# Patient Record
Sex: Female | Born: 1965 | Race: White | Hispanic: No | Marital: Single | Smoking: Never smoker
Health system: Southern US, Community
[De-identification: ages and names within clinical notes are randomized; demographics above are authoritative.]

## PROBLEM LIST (undated history)

## (undated) ENCOUNTER — Emergency Department (HOSPITAL_COMMUNITY): Admission: EM | Payer: Self-pay | Source: Home / Self Care

## (undated) DIAGNOSIS — E559 Vitamin D deficiency, unspecified: Secondary | ICD-10-CM

## (undated) DIAGNOSIS — R5381 Other malaise: Secondary | ICD-10-CM

## (undated) DIAGNOSIS — N951 Menopausal and female climacteric states: Secondary | ICD-10-CM

## (undated) DIAGNOSIS — IMO0002 Reserved for concepts with insufficient information to code with codable children: Secondary | ICD-10-CM

## (undated) DIAGNOSIS — H01009 Unspecified blepharitis unspecified eye, unspecified eyelid: Secondary | ICD-10-CM

## (undated) DIAGNOSIS — E785 Hyperlipidemia, unspecified: Secondary | ICD-10-CM

## (undated) DIAGNOSIS — F419 Anxiety disorder, unspecified: Secondary | ICD-10-CM

## (undated) DIAGNOSIS — R809 Proteinuria, unspecified: Secondary | ICD-10-CM

## (undated) DIAGNOSIS — G4700 Insomnia, unspecified: Secondary | ICD-10-CM

## (undated) DIAGNOSIS — R5383 Other fatigue: Secondary | ICD-10-CM

## (undated) DIAGNOSIS — F79 Unspecified intellectual disabilities: Secondary | ICD-10-CM

## (undated) DIAGNOSIS — E538 Deficiency of other specified B group vitamins: Secondary | ICD-10-CM

## (undated) DIAGNOSIS — M8580 Other specified disorders of bone density and structure, unspecified site: Secondary | ICD-10-CM

## (undated) DIAGNOSIS — K5900 Constipation, unspecified: Secondary | ICD-10-CM

## (undated) HISTORY — DX: Other fatigue: R53.83

## (undated) HISTORY — DX: Anxiety disorder, unspecified: F41.9

## (undated) HISTORY — DX: Proteinuria, unspecified: R80.9

## (undated) HISTORY — DX: Reserved for concepts with insufficient information to code with codable children: IMO0002

## (undated) HISTORY — DX: Vitamin D deficiency, unspecified: E55.9

## (undated) HISTORY — DX: Other specified disorders of bone density and structure, unspecified site: M85.80

## (undated) HISTORY — DX: Deficiency of other specified B group vitamins: E53.8

## (undated) HISTORY — DX: Unspecified intellectual disabilities: F79

## (undated) HISTORY — DX: Hyperlipidemia, unspecified: E78.5

## (undated) HISTORY — DX: Other malaise: R53.81

## (undated) HISTORY — DX: Insomnia, unspecified: G47.00

## (undated) HISTORY — DX: Menopausal and female climacteric states: N95.1

## (undated) HISTORY — DX: Constipation, unspecified: K59.00

## (undated) HISTORY — DX: Morbid (severe) obesity due to excess calories: E66.01

## (undated) HISTORY — DX: Unspecified blepharitis unspecified eye, unspecified eyelid: H01.009

---

## 2001-09-06 ENCOUNTER — Emergency Department (HOSPITAL_COMMUNITY): Admission: EM | Admit: 2001-09-06 | Discharge: 2001-09-06 | Payer: Self-pay

## 2001-09-06 ENCOUNTER — Encounter: Payer: Self-pay | Admitting: Emergency Medicine

## 2003-04-30 ENCOUNTER — Emergency Department (HOSPITAL_COMMUNITY): Admission: EM | Admit: 2003-04-30 | Discharge: 2003-04-30 | Payer: Self-pay | Admitting: Emergency Medicine

## 2009-05-29 ENCOUNTER — Emergency Department (HOSPITAL_COMMUNITY): Admission: EM | Admit: 2009-05-29 | Discharge: 2009-05-29 | Payer: Self-pay | Admitting: Emergency Medicine

## 2009-06-18 ENCOUNTER — Ambulatory Visit (HOSPITAL_COMMUNITY): Admission: RE | Admit: 2009-06-18 | Discharge: 2009-06-19 | Payer: Self-pay | Admitting: Surgery

## 2009-06-18 ENCOUNTER — Encounter (INDEPENDENT_AMBULATORY_CARE_PROVIDER_SITE_OTHER): Payer: Self-pay | Admitting: Surgery

## 2010-01-31 ENCOUNTER — Encounter: Payer: Self-pay | Admitting: Internal Medicine

## 2010-03-29 LAB — DIFFERENTIAL
Basophils Absolute: 0 10*3/uL (ref 0.0–0.1)
Basophils Relative: 0 % (ref 0–1)
Eosinophils Absolute: 0.2 10*3/uL (ref 0.0–0.7)
Monocytes Absolute: 0.8 10*3/uL (ref 0.1–1.0)
Neutro Abs: 8.4 10*3/uL — ABNORMAL HIGH (ref 1.7–7.7)
Neutrophils Relative %: 72 % (ref 43–77)

## 2010-03-29 LAB — COMPREHENSIVE METABOLIC PANEL
Albumin: 3.6 g/dL (ref 3.5–5.2)
Alkaline Phosphatase: 91 U/L (ref 39–117)
Alkaline Phosphatase: 147 U/L — ABNORMAL HIGH (ref 39–117)
BUN: 11 mg/dL (ref 6–23)
BUN: 9 mg/dL (ref 6–23)
CO2: 30 mEq/L (ref 19–32)
Chloride: 105 mEq/L (ref 96–112)
Chloride: 95 mEq/L — ABNORMAL LOW (ref 96–112)
Creatinine, Ser: 0.66 mg/dL (ref 0.4–1.2)
GFR calc Af Amer: >60 mL/min (ref >60)
GFR calc non Af Amer: >60 mL/min (ref >60)
Glucose, Bld: 92 mg/dL (ref 70–99)
Potassium: 4.1 mEq/L (ref 3.5–5.1)
Total Bilirubin: 0.2 mg/dL — ABNORMAL LOW (ref 0.3–1.2)
Total Bilirubin: 0.8 mg/dL (ref 0.3–1.2)

## 2010-03-29 LAB — URINALYSIS, ROUTINE W REFLEX MICROSCOPIC
Bilirubin Urine: NEGATIVE
Glucose, UA: NEGATIVE mg/dL
Ketones, ur: NEGATIVE mg/dL

## 2010-03-29 LAB — CBC
Hemoglobin: 13.8 g/dL (ref 12.0–15.0)
RBC: 4.4 MIL/uL (ref 3.87–5.11)

## 2010-03-29 LAB — URINE MICROSCOPIC-ADD ON

## 2010-03-29 LAB — PREGNANCY, URINE: Preg Test, Ur: NEGATIVE

## 2011-07-24 ENCOUNTER — Encounter (HOSPITAL_BASED_OUTPATIENT_CLINIC_OR_DEPARTMENT_OTHER): Payer: Self-pay | Admitting: *Deleted

## 2011-07-24 ENCOUNTER — Ambulatory Visit: Payer: Self-pay

## 2011-07-24 ENCOUNTER — Ambulatory Visit (INDEPENDENT_AMBULATORY_CARE_PROVIDER_SITE_OTHER): Payer: Self-pay | Admitting: Family Medicine

## 2011-07-24 ENCOUNTER — Emergency Department (HOSPITAL_BASED_OUTPATIENT_CLINIC_OR_DEPARTMENT_OTHER): Payer: Medicaid Other

## 2011-07-24 ENCOUNTER — Emergency Department (HOSPITAL_BASED_OUTPATIENT_CLINIC_OR_DEPARTMENT_OTHER)
Admission: EM | Admit: 2011-07-24 | Discharge: 2011-07-25 | Disposition: A | Discharge status: Home / Self Care | Payer: Medicaid Other | Attending: Emergency Medicine | Admitting: Emergency Medicine

## 2011-07-24 VITALS — BP 132/84 | HR 106 | Temp 98.7°F | Resp 18 | Ht 70.0 in | Wt 217.0 lb

## 2011-07-24 DIAGNOSIS — E860 Dehydration: Secondary | ICD-10-CM | POA: Insufficient documentation

## 2011-07-24 DIAGNOSIS — R109 Unspecified abdominal pain: Secondary | ICD-10-CM

## 2011-07-24 DIAGNOSIS — R1110 Vomiting, unspecified: Secondary | ICD-10-CM | POA: Insufficient documentation

## 2011-07-24 DIAGNOSIS — IMO0002 Reserved for concepts with insufficient information to code with codable children: Secondary | ICD-10-CM | POA: Insufficient documentation

## 2011-07-24 DIAGNOSIS — D72829 Elevated white blood cell count, unspecified: Secondary | ICD-10-CM | POA: Insufficient documentation

## 2011-07-24 DIAGNOSIS — T189XXA Foreign body of alimentary tract, part unspecified, initial encounter: Secondary | ICD-10-CM | POA: Insufficient documentation

## 2011-07-24 DIAGNOSIS — R10819 Abdominal tenderness, unspecified site: Secondary | ICD-10-CM | POA: Insufficient documentation

## 2011-07-24 LAB — URINE MICROSCOPIC-ADD ON

## 2011-07-24 LAB — URINALYSIS, ROUTINE W REFLEX MICROSCOPIC
Hgb urine dipstick: NEGATIVE
Ketones, ur: 15 mg/dL — AB
Protein, ur: 30 mg/dL — AB

## 2011-07-24 LAB — POCT CBC
HCT, POC: 47.4 % (ref 37.7–47.9)
Hemoglobin: 14.7 g/dL (ref 12.2–16.2)
Lymph, poc: 2.7 (ref 0.6–3.4)
MCV: 94.8 fL (ref 80–97)
Platelet Count, POC: 408 10*3/uL (ref 142–424)
RBC: 5 M/uL (ref 4.04–5.48)
WBC: 17.5 10*3/uL — AB (ref 4.6–10.2)

## 2011-07-24 LAB — COMPREHENSIVE METABOLIC PANEL
AST: 38 U/L — ABNORMAL HIGH (ref 0–37)
Albumin: 3.9 g/dL (ref 3.5–5.2)
BUN: 22 mg/dL (ref 6–23)
Calcium: 9.4 mg/dL (ref 8.4–10.5)
Creatinine, Ser: 0.7 mg/dL (ref 0.50–1.10)
Total Bilirubin: 0.3 mg/dL (ref 0.3–1.2)
Total Protein: 8.4 g/dL — ABNORMAL HIGH (ref 6.0–8.3)

## 2011-07-24 LAB — LIPASE, BLOOD: Lipase: 40 U/L (ref 11–59)

## 2011-07-24 MED ORDER — ONDANSETRON HCL 4 MG/2ML IJ SOLN: 2.0000 mg | Freq: Once | INTRAMUSCULAR | Status: AC

## 2011-07-24 MED ORDER — IOHEXOL 300 MG/ML  SOLN
40.0000 mL | Freq: Once | INTRAMUSCULAR | Status: AC | PRN
  Administered 2011-07-24: 40 mL via ORAL

## 2011-07-24 MED ORDER — ONDANSETRON HCL 4 MG/2ML IJ SOLN
4.0000 mg | Freq: Once | INTRAMUSCULAR | Status: AC
  Administered 2011-07-24: 4 mg via INTRAVENOUS
  Filled 2011-07-24: qty 2

## 2011-07-24 MED ORDER — SODIUM CHLORIDE 0.9 % IV BOLUS (SEPSIS)
1000.0000 mL | Freq: Once | INTRAVENOUS | Status: AC
  Administered 2011-07-24: 1000 mL via INTRAVENOUS

## 2011-07-24 MED ORDER — ONDANSETRON HCL 4 MG/2ML IJ SOLN
4.0000 mg | Freq: Once | INTRAMUSCULAR | Status: AC
  Administered 2011-07-24: 4 mg via INTRAVENOUS

## 2011-07-24 MED ORDER — IOHEXOL 300 MG/ML  SOLN
100.0000 mL | Freq: Once | INTRAMUSCULAR | Status: AC | PRN
  Administered 2011-07-24: 100 mL via INTRAVENOUS

## 2011-07-24 MED ORDER — MORPHINE SULFATE 4 MG/ML IJ SOLN
4.0000 mg | Freq: Once | INTRAMUSCULAR | Status: AC
  Administered 2011-07-24: 4 mg via INTRAVENOUS
  Filled 2011-07-24: qty 1

## 2011-07-24 MED ORDER — DOCUSATE SODIUM 100 MG PO CAPS: 100.0000 mg | ORAL_CAPSULE | Freq: Two times a day (BID) | ORAL | Status: AC

## 2011-07-24 MED ORDER — ONDANSETRON 4 MG PO TBDP: 4.0000 mg | ORAL_TABLET | Freq: Three times a day (TID) | ORAL | Status: AC | PRN

## 2011-07-24 NOTE — ED Notes (Addendum)
Vomiting for the last day. Mother states the patient swallowed a rubber piece from a toy and is unsure if the two are related. Was sent from clinic for elevated WBC and for an abd scan. 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

## 2011-07-24 NOTE — ED Notes (Signed)
Pt presents to ED today after being seen at [facility] for ? Swallowing of foreign body.  Pt has MR and is basically non-verbal.  Pt able to indicate that she has discomfort in abdomen.  Mother states that pt may have swallowed a piece of a plastic ball.  Pt did vomit shortly after.  Pt is in no visible distress at present

## 2011-07-24 NOTE — ED Notes (Signed)
Pt presents to ED today without indwelling IV catheter.  Not charted as removed from previous EPIC encounter.  

## 2011-07-24 NOTE — ED Provider Notes (Signed)
History     CSN: [id number]  Arrival date & time 07/24/11  1919   First MD Initiated Contact with Patient 07/24/11 2037      Chief Complaint  Patient presents with  . Emesis    (Consider location/radiation/quality/duration/timing/severity/associated sxs/prior treatment) HPI  46yoF h/o MR pw vomiting. Per mother pt has been vomiting x 2 days. Unable to tolerate Po at this time. Was seen at [facility] just pta with elevated WBC. She has been vomiting small pieces of a toy per mother. Last BM 3 days ago was normal. H/o chronic intermittent constipation. Denies fever. Pt nonverbal but mom seems to think she's in pain. Denies rash. Imm UTD. Mother noticed yesterday that she ingested pieces of a small soft rubber/plastic toy- was present in emesis previously. Vomiting worse today and unable to tolerate PO. Vomited several times prior to arrival. Was noted to have elevated WBC and nl XR abd series at [facility] prior to arrival. Referred to ED for further w/u and evaluation. Mom denies foul smelling urine.   Abd surgeries include cholecystectomy   ED Notes, ED Provider Notes from 07/24/11 0000 to 07/24/11 19:39:26       Flossie Dibble, RN 07/24/2011 19:34      Vomiting for the last day. Mother states the patient swallowed a rubber piece from a toy and is unsure if the two are related. Was sent from clinic for elevated WBC and for an abd scan. 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                 Original note by Flossie Dibble, RN at 07/24/2011 19:31         Flossie Dibble, RN 07/24/2011 19:31      Vomiting for the last day. Mother states the patient swallowed a rubber piece from a toy and is unsure if the two are related. Was sent from clinic for elevated WBC and for an abd scan.                History reviewed. No pertinent past medical history.  History reviewed. No pertinent past surgical history.  No family history on file.  History  Substance Use Topics  .  Smoking status: Never Smoker   . Smokeless tobacco: Not on file  . Alcohol Use: Not on file    OB History    Grav Para Term Preterm Abortions TAB SAB Ect Mult Living                  Review of Systems  All other systems reviewed and are negative.  except as noted HPI   Allergies  Compazine  Home Medications   Current Outpatient Rx  Name Route Sig Dispense Refill  . VITAMIN B12 PO Oral Take 1 tablet by mouth daily.    Marland Kitchen VITAMIN D (CHOLECALCIFEROL) PO Oral Take 1 tablet by mouth daily.    Marland Kitchen DOCUSATE SODIUM 100 MG PO CAPS Oral Take 1 capsule (100 mg total) by mouth every 12 (twelve) hours. 60 capsule 0  . ONDANSETRON 4 MG PO TBDP Oral Take 1 tablet (4 mg total) by mouth every 8 (eight) hours as needed for nausea. 10 tablet 0    BP 127/93  Pulse 88  Temp 98.4 F (36.9 C) (Oral)  Resp 18  SpO2 100%  LMP 07/11/2011  Physical Exam  Nursing note and vitals reviewed. Constitutional: She appears well-developed.  HENT:  Head: Atraumatic.  Mouth/Throat: No oropharyngeal exudate.  Mm dry  Eyes: Conjunctivae and EOM are normal. Pupils are equal, round, and reactive to light.  Neck: Normal range of motion. Neck supple.  Cardiovascular: Normal rate, regular rhythm, normal heart sounds and intact distal pulses.   Pulmonary/Chest: Effort normal and breath sounds normal. No respiratory distress. She has no wheezes. She has no rales.  Abdominal: Soft. She exhibits no distension. There is tenderness. There is no rebound and no guarding.       Mild diffuse ttp  Musculoskeletal: Normal range of motion.  Neurological: She is alert.  Skin: Skin is warm and dry. No rash noted.  Psychiatric:       Nonverbal but will respond to commands    Date: 07/25/2011  Rate: 78  Rhythm: normal sinus rhythm  QRS Axis: normal  Intervals: normal  ST/T Wave abnormalities: nonspecific T wave changes  Conduction Disutrbances:none  Narrative Interpretation:   Old EKG Reviewed: no sign change.  t wave inv now present V2  ED Course  Procedures (including critical care time)  Labs Reviewed  COMPREHENSIVE METABOLIC PANEL - Abnormal; Notable for the following:    Glucose, Bld 109 (*)     Total Protein 8.4 (*)     AST 38 (*)     All other components within normal limits  URINALYSIS, ROUTINE W REFLEX MICROSCOPIC - Abnormal; Notable for the following:    Color, Urine AMBER (*)  BIOCHEMICALS MAY BE AFFECTED BY COLOR   Specific Gravity, Urine 1.035 (*)     Bilirubin Urine SMALL (*)     Ketones, ur 15 (*)     Protein, ur 30 (*)     All other components within normal limits  LIPASE, BLOOD  URINE MICROSCOPIC-ADD ON   Ct Abdomen Pelvis W Contrast  07/24/2011  *RADIOLOGY REPORT*  Clinical Data: Possible swallowed foreign body.  The patient has mental retardation and is nonverbal but indicates abdominal pain. Vomiting.  Elevated white cell count.  CT ABDOMEN AND PELVIS WITH CONTRAST  Technique:  Multidetector CT imaging of the abdomen and pelvis was performed following the standard protocol during bolus administration of intravenous contrast.  Contrast: OMNIPAQUE IOHEXOL 300 MG/ML  SOLN, 40mL OMNIPAQUE IOHEXOL 300 MG/ML  SOLN  Comparison: 05/29/2009  Findings: The study is somewhat technically limited due to motion artifact.  Mild dependent atelectasis in the lung bases.  Solid appearing low attenuation lesion in the lateral segment left lobe the liver measures 2.1 cm in diameter, similar to previous study.  No new liver lesions are appreciated.  Enhancement pattern is indeterminate.  Stability since 2011 suggest a benign lesion.  Surgical absence of the gallbladder.  The pancreas, spleen, adrenal glands, kidneys, abdominal aorta, and retroperitoneal lymph nodes are unremarkable. The stomach is decompressed without obvious wall thickening.  Small bowel are decompressed.  Gas and stool in the colon without distension or wall thickening.  There is a rounded peripherally dense structure located  in the cecum which may represent ingested foreign body.  No free air or free fluid in the abdomen.  Pelvis:  The uterus and adnexal structures are not enlarged. Bladder is decompressed.  No free or loculated pelvic fluid collections.  The appendix appears normal.  No significant pelvic lymphadenopathy.  Degenerative changes in the lumbar spine. Visualization of alignment is limited due to motion artifact but appears to be normal.  IMPRESSION: Peripherally dense structure demonstrated in the cecum probably represents an ingested foreign body, given the history.  No evidence of bowel perforation, free fluid,  or free air.  No other focal acute process demonstrated.  Stable appearance of a hypodense lesion in the lateral segment left lobe of the liver.  Original Report Authenticated By: Marlon Pel, M.D.   Dg Abd 2 Views  07/24/2011  *RADIOLOGY REPORT*  Clinical Data: Abdominal pain and vomiting.  ABDOMEN - 2 VIEW  Comparison: Abdominal pelvic CT of 05/29/2009.  Findings: Frontal view of the chest demonstrates midline trachea. Normal heart size.  No pleural effusion or pneumothorax.  Mildly low lung volumes.  Mild vascular crowding at the lung bases.  Abominal films demonstrate motion degraded first image.  Subsequent 2 images demonstrate cholecystectomy clips.  Mild S-shaped thoracolumbar spine curvature.  No significant bowel distention. No definite upright view submitted.  Distal gas.  Phleboliths in the pelvis.  No abnormal abdominal calcifications.  IMPRESSION: No acute findings.  Original Report Authenticated By: Consuello Bossier, M.D.    1. Foreign body ingestion   2. Leukocytosis   3. Abdominal pain   Dehydration  MDM  Presents with abdominal pain, concern for obstruction with vomiting. VSS. Mild abd ttp on exam without r/g. Leukocytosis PTA 17K (See [facility] note in EPIC) Hgb ok-- did not repeat. Electrolytes unremarkable. CT as above- possible FB in cecum. No perforation or obstruction. Pt has been  given IVF, zofran, morphine. Tolerating PO in the Emergency Department without vomiting. Does not appear to be in pain at this time. D/W GI on call- home v/s admission for obs depending on Mother. Discussed extensively with mom/caregiver-- she would like to monitor her at home for fever, worsening pain, vomiting. She will f/u with her PMD in 2 days for recheck, sooner if needed. Given copy of all labs, XR, CT A/P prior to discharge. Given strict precautions for return.        Forbes Cellar, MD 07/25/11 [phone]

## 2011-07-24 NOTE — Progress Notes (Signed)
Date:  07/24/2011   Name:  Lauren Farmer   DOB:  Dec 01, 1965   MRN:  [id number]  PCP:  No primary provider on file.    Chief Complaint: Emesis and Constipation   History of Present Illness:  Lauren Farmer is a 46 y.o. very pleasant female patient who presents with the following:  Lauren Farmer is here today with her mother. 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  She ate some "fingers" off of a ball yesterday (a soft rubbery ball with stretchy rubbery fingers- a popular toy)- she then vomited,seeming to bring up the objects that she has swallowed.  However, has continued to have vomiting since yesterday.  She has vomitied every few minutes until her arrival here.  She has not been able to tolerate any fluids or any food.  She also has not had a BM in at least 2 days.  Mother has not noted any fever.  No diarrhea.    She has suffered from constipation in the past.  Never had any bowel operations- has had a chole but no other procedures.  She lives with her mother.    There is no problem list on file for this patient.   No past medical history on file.  No past surgical history on file.  History  Substance Use Topics  . Smoking status: Never Smoker   . Smokeless tobacco: Not on file  . Alcohol Use: Not on file    No family history on file.  Allergies no known allergies  Medication list has been reviewed and updated.  Current Outpatient Prescriptions on File Prior to Visit  Medication Sig Dispense Refill  . calcium-vitamin D (OSCAL WITH D) 500-200 MG-UNIT per tablet Take 1 tablet by mouth daily.        Review of Systems:  As per HPI- otherwise negative. Lauren Farmer is not able to verbally communicate her symptoms  Physical Examination: Filed Vitals:   07/24/11 1648  BP: 132/84  Pulse: 106  Temp: 98.7 F (37.1 C)  Resp: 18   Filed Vitals:   07/24/11 1648  Height: 5\' 10"  (1.778 m)  Weight: 217 lb (98.431 kg)   Body  mass index is 31.14 kg/(m^2). Ideal Body Weight: Weight in (lb) to have BMI = 25: 173.9   GEN: WDWN, NAD, Non-toxic, obese, is calm but does not seem happy HEENT: Atraumatic, Normocephalic. Neck supple. No masses.  Mouth is dry and red.  PEERl Ears and Nose: No external deformity. CV: RRR, No M/G/R. No JVD. No thrill. No extra heart sounds. PULM: CTA B, no wheezes, crackles, rhonchi. No retractions. No resp. distress. No accessory muscle use. ABD: S, ND.  There is some guarding in the epigastric area.  Not able to pinpoint area of tenderness.  She has active bowel sounds.   EXTR: No c/c/e NEURO Normal gait.  PSYCH: cooperative, and is easily calmed by mother.  Appropriate reactions to starting IV, etc.  Alert.   [facility] reading (PRIMARY) by  Dr. Patsy Lager.  Negative abdominal series.    ABDOMEN - 2 VIEW  Comparison: Abdominal pelvic CT of 05/29/2009.  Findings: Frontal view of the chest demonstrates midline trachea. Normal heart size. No pleural effusion or pneumothorax. Mildly low lung volumes. Mild vascular crowding at the lung bases.  Abominal films demonstrate motion degraded first image. Subsequent 2 images demonstrate cholecystectomy clips. Mild S-shaped thoracolumbar spine curvature. No significant bowel distention. No definite upright view submitted.  Distal gas. Phleboliths in the pelvis. No abnormal abdominal calcifications.  IMPRESSION: No acute findings.  Results for orders placed in visit on 07/24/11  POCT CBC      Component Value Range   WBC 17.5 (*) 4.6 - 10.2 K/uL   Lymph, poc 2.7  0.6 - 3.4   POC LYMPH PERCENT 15.2  10 - 50 %L   MID (cbc) 0.8  0 - 0.9   POC MID % 4.5  0 - 12 %M   POC Granulocyte 14.1 (*) 2 - 6.9   Granulocyte percent 80.3 (*) 37 - 80 %G   RBC 5.00  4.04 - 5.48 M/uL   Hemoglobin 14.7  12.2 - 16.2 g/dL   HCT, POC 96.0  45.4 - 47.9 %   MCV 94.8  80 - 97 fL   MCH, POC 29.4  27 - 31.2 pg   MCHC 31.0 (*) 31.8 - 35.4 g/dL   RDW, POC 09.8      Platelet Count, POC 408  142 - 424 K/uL   MPV 9.3  0 - 99.8 fL   Started IV in right AC (NS).  Also gave 4mg  of IV zofran.  However, when CBC results available the IV was discontinued.  She received about 400mg  of NS.    Assessment and Plan: 1. Abdominal  pain, other specified site  DG Abd 2 Views, POCT CBC, ondansetron (ZOFRAN) injection 2 mg, ondansetron (ZOFRAN) injection 4 mg   Concerning history of vomiting, abdominal pain, ingestion of FB and elevated WBC count. She may have an obstruction.  I advised her mother that Lauren Farmer needs ED evaluation and likely a CT scan. She is agreeable to this plan.  Waiting for a long time is difficult for Lauren Farmer, so her mother plans to take her to the [facility] ED.  She is aware that if Lauren Farmer needs admission, surgery, etc that a transfer will be necessary.    Abbe Amsterdam, MD

## 2011-07-24 NOTE — ED Notes (Signed)
Patient uses bedside commode with minimal assist.

## 2011-11-20 IMAGING — CT CT ABD-PELV W/ CM
2 of 6 series · 17 of 46 positions shown, 19 images · IV contrast (agent unspecified)
Comparison: None available. Report of CT scan 09/06/2001 reviewed.

CLINICAL DATA: Abdominal pain.  History prior bowel obstruction.

CT ABDOMEN AND PELVIS WITH CONTRAST
TECHNIQUE: Multidetector CT imaging of the abdomen and pelvis was
performed following the standard protocol during bolus
administration of intravenous contrast.
Contrast: 96 ml [id number].

[Series 2: abd pelvis · axial · 0.83mm/px · z∈[-368,-3]mm · 14 of 83 slices shown, 16 images]
[im 5/83  soft-tissue]
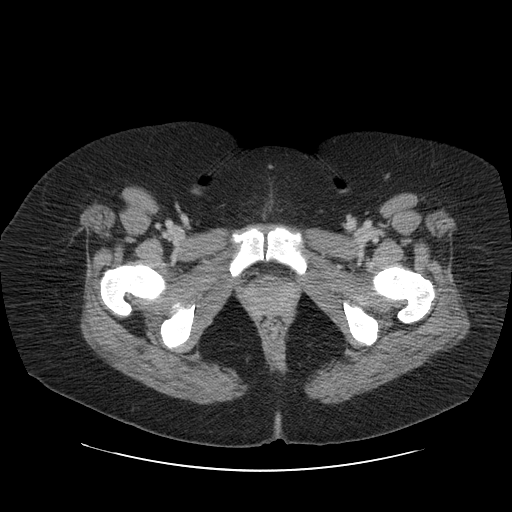
[im 5/83  bone]
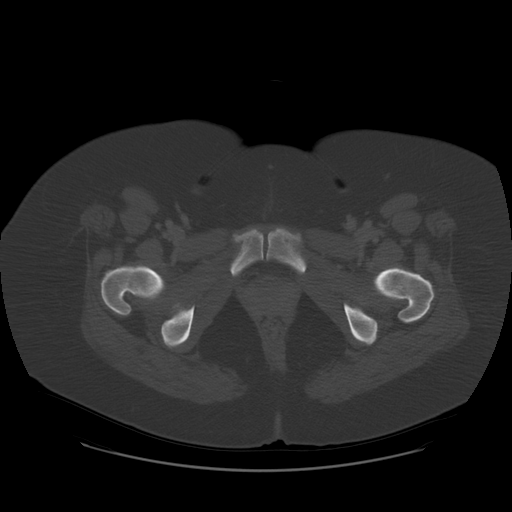
[im 10/83  soft-tissue]
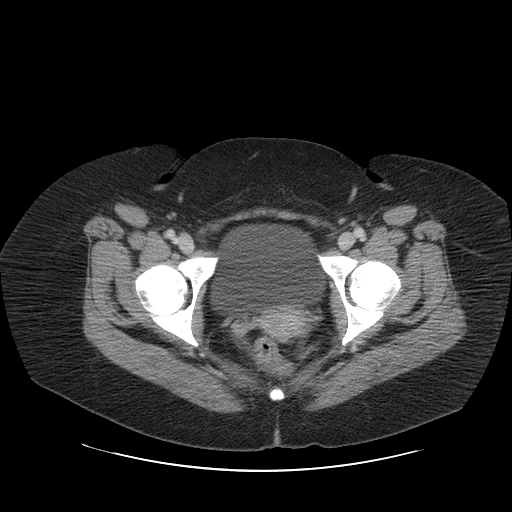
[im 19/83  soft-tissue]
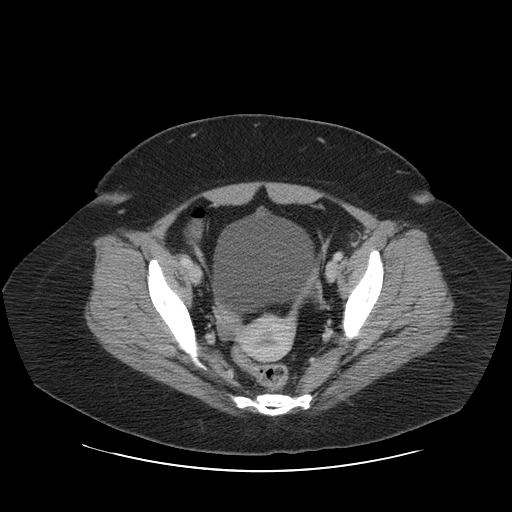
[im 23/83  soft-tissue]
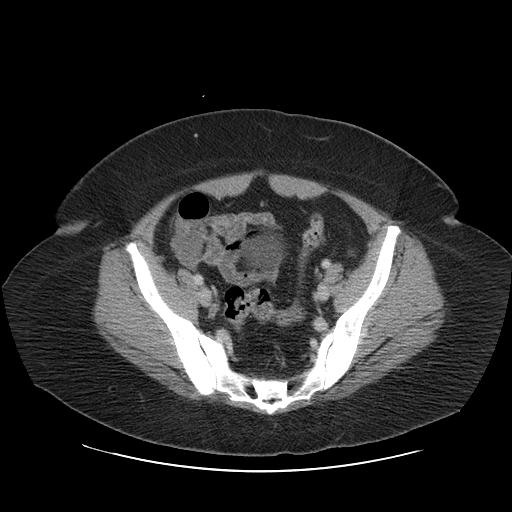
[im 28/83  soft-tissue]
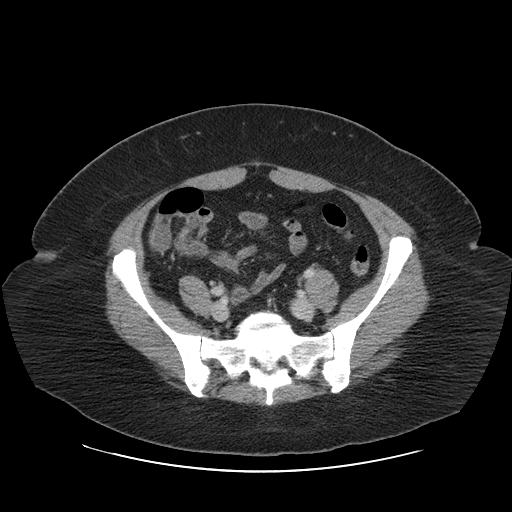
[im 32/83  soft-tissue]
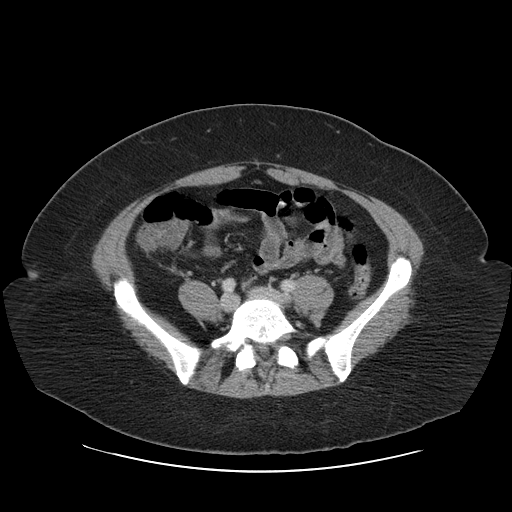
[im 37/83  soft-tissue]
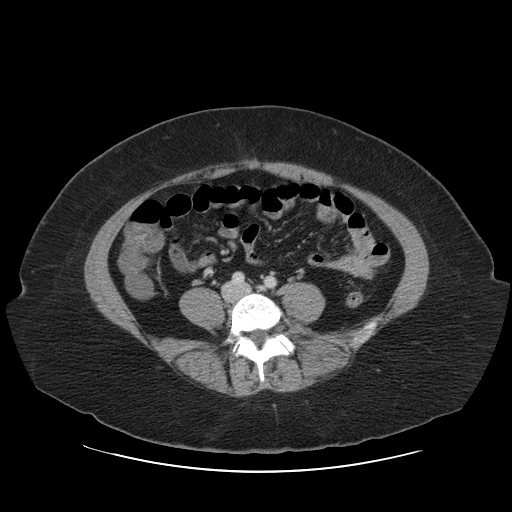
[im 46/83  soft-tissue]
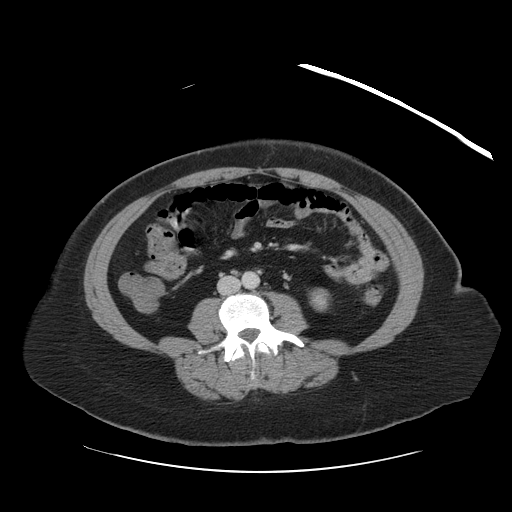
[im 51/83  soft-tissue]
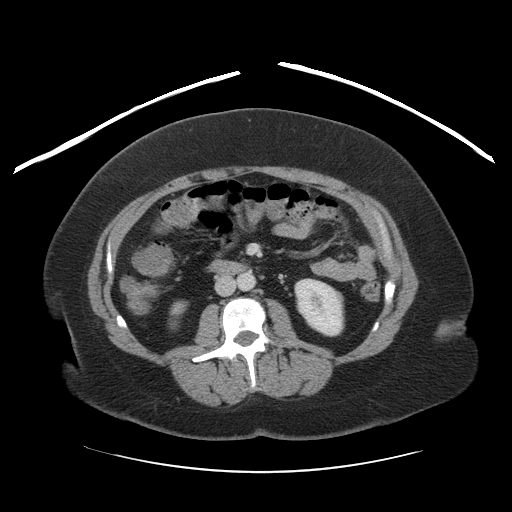
[im 51/83  bone]
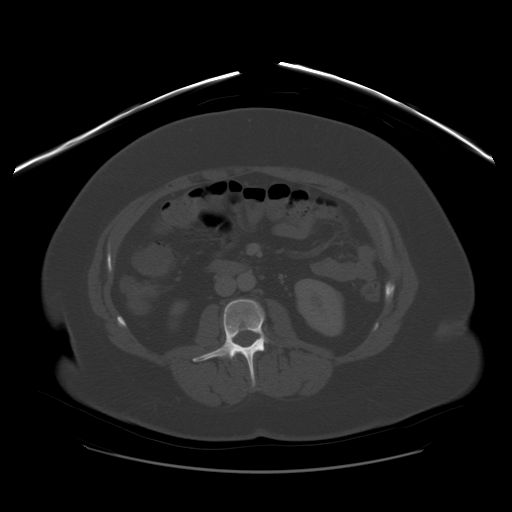
[im 55/83  soft-tissue]
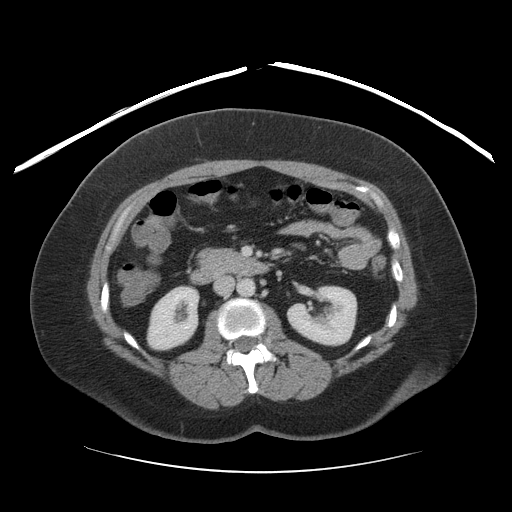
[im 60/83  soft-tissue]
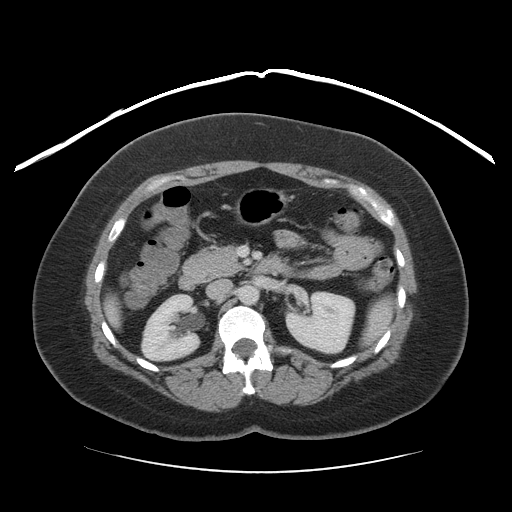
[im 64/83  soft-tissue]
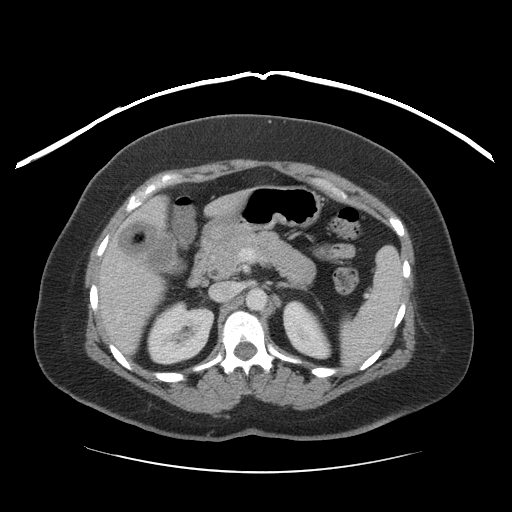
[im 73/83  soft-tissue]
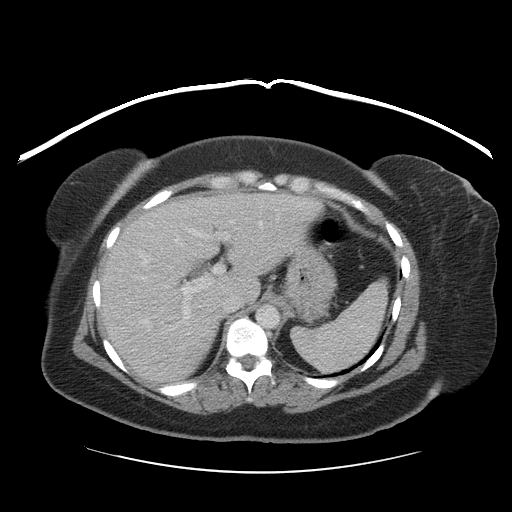
[im 78/83  soft-tissue]
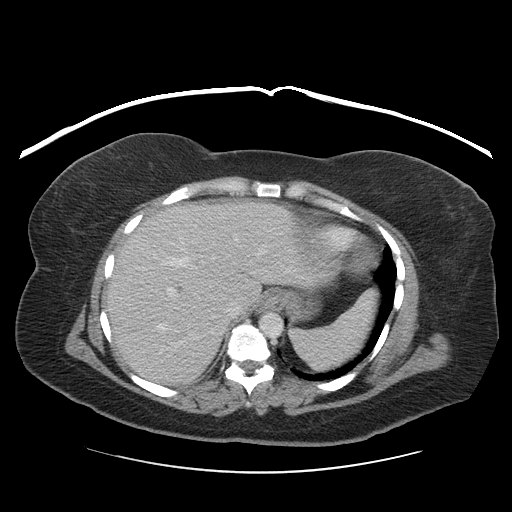

[Series 400: reformatted · coronal · 0.87mm/px · 3 of 90 slices shown]
[im 30/90  soft-tissue]
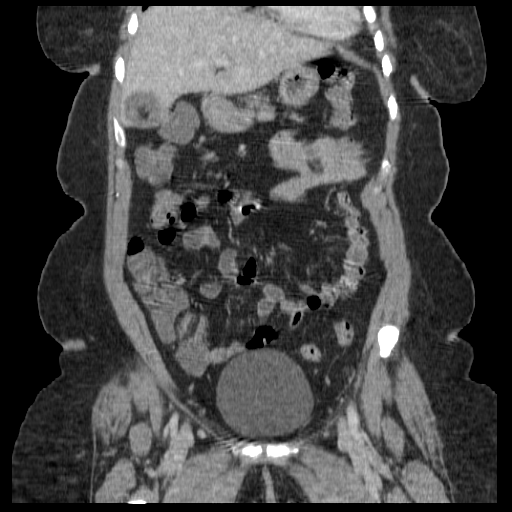
[im 40/90  soft-tissue]
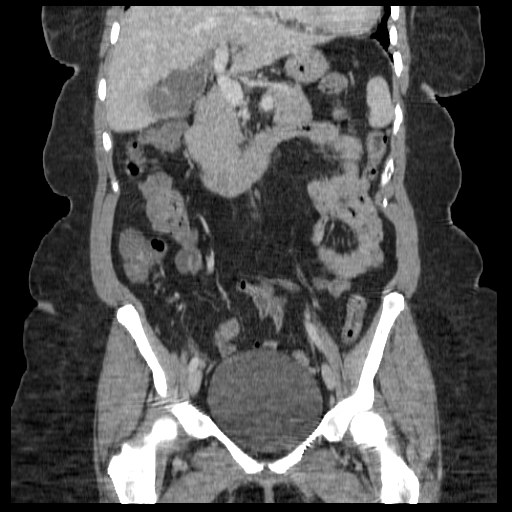
[im 50/90  soft-tissue]
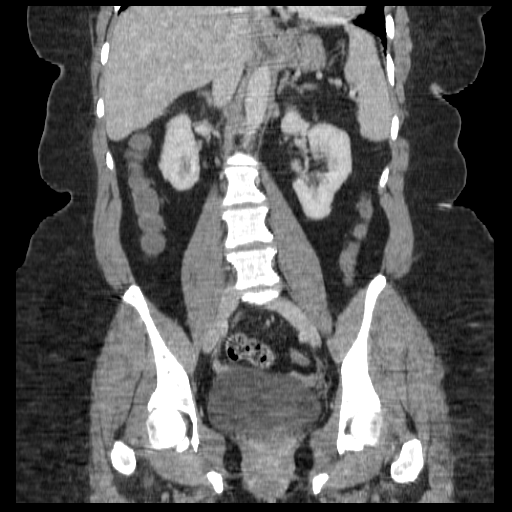

[17 of 46 positions shown; findings below may reference images not displayed]

FINDINGS: There is some dependent atelectatic change in the lung
bases.  Very small right pleural effusion is noted.  No left
pleural effusion.  Walls of the distal esophagus are markedly
thickened.

There is a large stone in the fundus the gallbladder measuring
cm.  No CT evidence of cholecystitis. There is a 1.8 x 1.8 cm
lesion in the left lobe the liver with Hounsfield unit measurements
of 59.5.  The lesion becomes nearly isointense to liver parenchyma
on delayed imaging.  No other focal liver lesion.  No intrahepatic
biliary ductal dilatation.  The adrenal glands, pancreas, spleen
and kidneys are all unremarkable.  There is no abdominal
lymphadenopathy or fluid.

The uterus, adnexa and urinary bladder are unremarkable.  The
stomach, colon and small bowel all appear normal.  No focal bony
abnormality.
IMPRESSION: 1.  Tiny right pleural effusion.
2.  Thickening of the walls of the distal esophagus could be due to
esophagitis.
3.  Low attenuation lesion in left lobe of the liver.  Similar
finding is described report of prior CT and is described as a
hemangioma.
4.  Single gallstone without evidence of cholecystitis.

## 2012-04-03 ENCOUNTER — Other Ambulatory Visit: Payer: Self-pay | Admitting: *Deleted

## 2012-04-03 DIAGNOSIS — IMO0002 Reserved for concepts with insufficient information to code with codable children: Secondary | ICD-10-CM

## 2012-04-03 DIAGNOSIS — F79 Unspecified intellectual disabilities: Secondary | ICD-10-CM

## 2012-04-03 DIAGNOSIS — E669 Obesity, unspecified: Secondary | ICD-10-CM

## 2012-04-12 ENCOUNTER — Other Ambulatory Visit: Payer: Self-pay

## 2012-04-12 DIAGNOSIS — IMO0002 Reserved for concepts with insufficient information to code with codable children: Secondary | ICD-10-CM

## 2012-04-12 DIAGNOSIS — E669 Obesity, unspecified: Secondary | ICD-10-CM

## 2012-04-12 DIAGNOSIS — F79 Unspecified intellectual disabilities: Secondary | ICD-10-CM

## 2012-04-13 LAB — TSH: TSH: 2.39 u[IU]/mL (ref 0.450–4.500)

## 2012-04-17 ENCOUNTER — Ambulatory Visit (INDEPENDENT_AMBULATORY_CARE_PROVIDER_SITE_OTHER): Payer: Medicaid Other | Admitting: Internal Medicine

## 2012-04-17 ENCOUNTER — Encounter: Payer: Self-pay | Admitting: Internal Medicine

## 2012-04-17 VITALS — BP 134/88 | HR 96 | Temp 98.3°F | Resp 20 | Ht 63.0 in | Wt 221.0 lb

## 2012-04-17 DIAGNOSIS — E785 Hyperlipidemia, unspecified: Secondary | ICD-10-CM

## 2012-04-17 DIAGNOSIS — F79 Unspecified intellectual disabilities: Secondary | ICD-10-CM

## 2012-04-17 DIAGNOSIS — E538 Deficiency of other specified B group vitamins: Secondary | ICD-10-CM

## 2012-04-17 DIAGNOSIS — J209 Acute bronchitis, unspecified: Secondary | ICD-10-CM

## 2012-04-17 NOTE — Patient Instructions (Signed)
Call for relapse of symptoms.

## 2012-04-17 NOTE — Assessment & Plan Note (Addendum)
2 weeks ago. Treated with azithromycin and Florastor. Symptoms have resolved.

## 2012-06-19 NOTE — Progress Notes (Signed)
  Subjective:    Patient ID: Lauren Farmer, female    DOB: 1965-05-25, 47 y.o.   MRN: [id number]  HPI Patient is here for an acute visit. He is a sore throat recently and complains of a cough and chest congestion does have a low-grade fever. She is producing yellow purulent sputum. No hemoptysis.   Review of Systems  Constitutional: Positive for fever. Negative for chills, diaphoresis, activity change, appetite change, fatigue and unexpected weight change.       Morbid obesity  HENT: Positive for rhinorrhea.   Eyes: Negative.   Respiratory: Positive for cough. Negative for shortness of breath and wheezing.   Cardiovascular: Negative for chest pain, palpitations and leg swelling.  Gastrointestinal: Negative.  Negative for nausea, abdominal pain, diarrhea and abdominal distention.  Endocrine: Negative.   Genitourinary: Negative.   Musculoskeletal: Negative for myalgias, back pain, joint swelling and gait problem.  Skin: Negative.   Neurological: Negative for dizziness, tremors, seizures, syncope, facial asymmetry and headaches.       Mentally retarded.  Hematological: Negative.   Psychiatric/Behavioral: Negative.        Objective:  Filed Vitals:   04/17/12 1608  BP: 134/88  Pulse: 96  Temp: 98.3 F (36.8 C)  TempSrc: Oral  Resp: 20  Height: 5\' 3"  (1.6 m)  Weight: 221 lb (100.245 kg)  SpO2: 97%          Physical Exam  Constitutional: She is oriented to person, place, and time. No distress.  Morbid obesity.  HENT:  Head: Normocephalic.  Right Ear: External ear normal.  Left Ear: External ear normal.  Nose: Nose normal.  Mouth/Throat: No oropharyngeal exudate.  Eyes: Conjunctivae are normal. Pupils are equal, round, and reactive to light. Right eye exhibits no discharge. Left eye exhibits no discharge.  Neck: Normal range of motion. Neck supple. No JVD present. No tracheal deviation present. No thyromegaly present.  Cardiovascular: Normal rate, regular rhythm and intact  distal pulses.  Exam reveals no gallop and no friction rub.   No murmur heard. Pulmonary/Chest: Effort normal. No respiratory distress. She has no wheezes. She has rales. She exhibits no tenderness.  Slight bronchial rattle.  Abdominal: Soft. Bowel sounds are normal. She exhibits no distension and no mass. There is no tenderness.  Musculoskeletal: Normal range of motion. She exhibits no edema and no tenderness.  Lymphadenopathy:    She has no cervical adenopathy.  Neurological: She is alert and oriented to person, place, and time. No cranial nerve deficit. Coordination normal.  Skin: Skin is warm and dry. No rash noted. No erythema. No pallor.  Psychiatric: She has a normal mood and affect.  Impulsivity. Cheerful. Mentally retarded.       Assessment & Plan:  Mental retardation  Chronic. Unchanged. Morbid obesity  No change in weight. Dietary control especially difficult. Hyperlipidemia  No recent lab. Vitamin B12 deficiency  Receiving B12 injections. Acute bronchitis  Seems to be clearing up at this time. No additional medicines are felt warranted. Her mother is to call us if symptoms relapse or if she runs fevers.

## 2012-08-14 ENCOUNTER — Ambulatory Visit (INDEPENDENT_AMBULATORY_CARE_PROVIDER_SITE_OTHER): Payer: Medicaid Other | Admitting: Nurse Practitioner

## 2012-08-14 ENCOUNTER — Other Ambulatory Visit: Payer: Self-pay | Admitting: Geriatric Medicine

## 2012-08-14 ENCOUNTER — Encounter: Payer: Self-pay | Admitting: Nurse Practitioner

## 2012-08-14 VITALS — BP 126/80 | HR 98 | Temp 98.1°F | Resp 20 | Ht 63.0 in | Wt 217.6 lb

## 2012-08-14 DIAGNOSIS — R109 Unspecified abdominal pain: Secondary | ICD-10-CM

## 2012-08-14 DIAGNOSIS — K5900 Constipation, unspecified: Secondary | ICD-10-CM

## 2012-08-14 DIAGNOSIS — B379 Candidiasis, unspecified: Secondary | ICD-10-CM

## 2012-08-14 MED ORDER — NYSTATIN 100000 UNIT/GM EX POWD: CUTANEOUS | Status: AC

## 2012-08-14 MED ORDER — FLUCONAZOLE 100 MG PO TABS: ORAL_TABLET | ORAL | Status: AC

## 2012-08-14 NOTE — Patient Instructions (Addendum)
Will get labs and xrays  To follow up if still having symptoms in 1 week

## 2012-08-14 NOTE — Progress Notes (Signed)
Patient ID: Lauren Farmer, female   DOB: 1965-11-03, 47 y.o.   MRN: [id number]   Allergies  Allergen Reactions  . Compazine (Prochlorperazine Edisylate)     Nervous, jerking head    Chief Complaint  Patient presents with  . Acute Visit    stomach pain,    HPI: Patient is a 47 y.o. female seen in the office today due to abdominal pains per mother  Pt with hx of mental retardation and does not talk and can not provide history; per mother for 2 weeks pt has whined a lot and brushes teeth constantly; will sit on the toilet-- mother was worried she was constipation- gave her docusate- was having BMs however was still complaining. Had 2 large BMs on Saturday and Sunday. Pt will sit on top of the commode with lid down and urinating on a pad; rolls the whole roll toliet paper off the roll. Mother reports she is still having regular menses  Goes to adult daycare- and mother asked if she had any alone time with any residents there; possible sexual contact- they reported this was not possible. Mother noted an increase in being anxious and nervous No fevers, appetite has been good- no change  Review of Systems:  Difficult to obtain; mostly from mother Review of Systems  Constitutional: Negative for fever and chills.  HENT: Negative for sore throat.        Reports teeth hurt  Respiratory: Negative for cough and shortness of breath.   Cardiovascular: Negative for chest pain.  Gastrointestinal: Positive for abdominal pain (points to lower abdomen) and constipation (per mother). Negative for diarrhea.  Genitourinary: Positive for frequency. Negative for dysuria and urgency.  Skin: Negative.   Neurological: Negative for weakness.  Psychiatric/Behavioral: The patient is nervous/anxious.      Past Medical History  Diagnosis Date  . Hyperlipidemia   . Anxiety   . Vitamin B12 deficiency   . Insomnia   . Unspecified mental or behavioral problem   . Intellectual disability   . Unspecified vitamin D  deficiency   . Morbid obesity   . Blepharitis, unspecified   . Unspecified constipation   . Other malaise and fatigue   . Proteinuria   . Osteopenia, senile   . Postmenopausal disorder   . Mental retardation    History reviewed. No pertinent past surgical history. Social History:   reports that she has never smoked. She does not have any smokeless tobacco history on file. She reports that she does not drink alcohol. Her drug history is not on file.  No family history on file.  Medications: Patient's Medications  New Prescriptions   No medications on file  Previous Medications   CYANOCOBALAMIN (VITAMIN B12 PO)    Take 1 tablet by mouth daily.   VITAMIN D, CHOLECALCIFEROL, PO    Take 1 tablet by mouth daily.  Modified Medications   No medications on file  Discontinued Medications   No medications on file     Physical Exam:  Filed Vitals:   08/14/12 1014  BP: 126/80  Pulse: 98  Temp: 98.1 F (36.7 C)  TempSrc: Oral  Resp: 20  Height: 5\' 3"  (1.6 m)  Weight: 217 lb 9.6 oz (98.703 kg)  SpO2: 97%   Physical Exam  Vitals reviewed. Constitutional: She is well-developed, well-nourished, and in no distress. No distress.  HENT:  Head: Normocephalic and atraumatic.  Mouth/Throat: Oropharynx is clear and moist. No oropharyngeal exudate.  Eyes: Conjunctivae and EOM are normal. Pupils  are equal, round, and reactive to light.  Neck: Normal range of motion. Neck supple.  Cardiovascular: Normal rate, regular rhythm and normal heart sounds.   Abdominal: Soft. Bowel sounds are normal. She exhibits no distension. There is tenderness.  Abdominal exam limited- pt ticklish per mother and she does not allow for proper exam   Genitourinary: Vagina normal. No vaginal discharge found.  No discharge noted  Musculoskeletal: Normal range of motion. She exhibits no edema and no tenderness.  Neurological: She is alert.  Skin: Skin is warm and dry. Rash (yeast rash in groin and vaginal area)  noted. She is not diaphoretic.     Labs reviewed: Basic Metabolic Panel:  Recent Labs  09/81/19 0858  TSH 2.390     Assessment/Plan 1. Abdominal pain At this time will get xrya and labs; will sent urine off for UA C&S due to rash will give diflcan daily for 3 days - DG Abd 1 View - CBC With differential/Platelet - Comprehensive metabolic panel - Urine culture - Urinalysis - fluconazole (DIFLUCAN) 100 MG tablet; Take 1 tablet daily for 3 days  Dispense: 3 tablet; Refill: 0  2. Candida infection Yeast rash- diflucan ordered - nystatin (MYCOSTATIN/NYSTOP) 100000 UNIT/GM POWD; Apply to affected areas as needed  Dispense: 15 g; Refill: 0  3. Constipation Cont stool softener; may use Miralax 17 gm daily if needed for constipation   To follow up in 1 week if no improvements with current treatment

## 2012-08-15 ENCOUNTER — Ambulatory Visit
Admission: RE | Admit: 2012-08-15 | Discharge: 2012-08-15 | Disposition: A | Discharge status: Home / Self Care | Payer: Medicaid Other | Source: Ambulatory Visit | Attending: Nurse Practitioner | Admitting: Nurse Practitioner

## 2012-08-15 LAB — CBC WITH DIFFERENTIAL
Basophils Absolute: 0 10*3/uL (ref 0.0–0.2)
Basos: 0 % (ref 0–3)
Eosinophils Absolute: 0.1 10*3/uL (ref 0.0–0.4)
HCT: 39.4 % (ref 34.0–46.6)
Lymphs: 18 % (ref 14–46)
MCH: 30 pg (ref 26.6–33.0)
MCHC: 33.8 g/dL (ref 31.5–35.7)
Monocytes Absolute: 1.2 10*3/uL — ABNORMAL HIGH (ref 0.1–0.9)
Neutrophils Absolute: 9.7 10*3/uL — ABNORMAL HIGH (ref 1.4–7.0)
RDW: 13.1 % (ref 12.3–15.4)

## 2012-08-15 LAB — COMPREHENSIVE METABOLIC PANEL
ALT: 21 IU/L (ref 0–32)
AST: 22 IU/L (ref 0–40)
Albumin/Globulin Ratio: 1.3 (ref 1.1–2.5)
Alkaline Phosphatase: 107 IU/L (ref 39–117)
BUN/Creatinine Ratio: 11 (ref 9–23)
Chloride: 98 mmol/L (ref 97–108)
GFR calc Af Amer: 121 mL/min/{1.73_m2} (ref >59)
GFR calc non Af Amer: 105 mL/min/{1.73_m2} (ref >59)
Glucose: 89 mg/dL (ref 65–99)
Potassium: 3.9 mmol/L (ref 3.5–5.2)
Sodium: 137 mmol/L (ref 134–144)
Total Bilirubin: 0.3 mg/dL (ref 0.0–1.2)
Total Protein: 7.6 g/dL (ref 6.0–8.5)

## 2012-08-15 LAB — URINE CULTURE

## 2012-10-17 ENCOUNTER — Ambulatory Visit: Payer: Self-pay | Admitting: Internal Medicine

## 2012-12-19 ENCOUNTER — Ambulatory Visit: Payer: Self-pay | Admitting: Internal Medicine

## 2017-01-27 ENCOUNTER — Emergency Department (HOSPITAL_COMMUNITY): Payer: Medicaid Other

## 2017-01-27 ENCOUNTER — Inpatient Hospital Stay (HOSPITAL_COMMUNITY): Payer: Medicaid Other

## 2017-01-27 ENCOUNTER — Encounter (HOSPITAL_COMMUNITY): Payer: Self-pay | Admitting: Emergency Medicine

## 2017-01-27 ENCOUNTER — Inpatient Hospital Stay (HOSPITAL_COMMUNITY)
Admission: EM | Admit: 2017-01-27 | Discharge: 2017-02-10 | DRG: 286 | Disposition: E | Payer: Medicaid Other | Attending: General Surgery | Admitting: General Surgery

## 2017-01-27 DIAGNOSIS — Z66 Do not resuscitate: Secondary | ICD-10-CM | POA: Diagnosis not present

## 2017-01-27 DIAGNOSIS — J9601 Acute respiratory failure with hypoxia: Secondary | ICD-10-CM | POA: Diagnosis not present

## 2017-01-27 DIAGNOSIS — Z4659 Encounter for fitting and adjustment of other gastrointestinal appliance and device: Secondary | ICD-10-CM

## 2017-01-27 DIAGNOSIS — E8770 Fluid overload, unspecified: Secondary | ICD-10-CM | POA: Diagnosis not present

## 2017-01-27 DIAGNOSIS — R402213 Coma scale, best verbal response, none, at hospital admission: Secondary | ICD-10-CM | POA: Diagnosis present

## 2017-01-27 DIAGNOSIS — S2249XA Multiple fractures of ribs, unspecified side, initial encounter for closed fracture: Secondary | ICD-10-CM | POA: Diagnosis present

## 2017-01-27 DIAGNOSIS — I361 Nonrheumatic tricuspid (valve) insufficiency: Secondary | ICD-10-CM | POA: Diagnosis not present

## 2017-01-27 DIAGNOSIS — D649 Anemia, unspecified: Secondary | ICD-10-CM | POA: Diagnosis present

## 2017-01-27 DIAGNOSIS — R402313 Coma scale, best motor response, none, at hospital admission: Secondary | ICD-10-CM | POA: Diagnosis present

## 2017-01-27 DIAGNOSIS — Y92410 Unspecified street and highway as the place of occurrence of the external cause: Secondary | ICD-10-CM | POA: Diagnosis not present

## 2017-01-27 DIAGNOSIS — E872 Acidosis: Secondary | ICD-10-CM | POA: Diagnosis present

## 2017-01-27 DIAGNOSIS — E669 Obesity, unspecified: Secondary | ICD-10-CM | POA: Diagnosis present

## 2017-01-27 DIAGNOSIS — E870 Hyperosmolality and hypernatremia: Secondary | ICD-10-CM | POA: Diagnosis not present

## 2017-01-27 DIAGNOSIS — Z6836 Body mass index (BMI) 36.0-36.9, adult: Secondary | ICD-10-CM

## 2017-01-27 DIAGNOSIS — J189 Pneumonia, unspecified organism: Secondary | ICD-10-CM | POA: Diagnosis present

## 2017-01-27 DIAGNOSIS — G9382 Brain death: Secondary | ICD-10-CM | POA: Diagnosis not present

## 2017-01-27 DIAGNOSIS — J9811 Atelectasis: Secondary | ICD-10-CM | POA: Diagnosis present

## 2017-01-27 DIAGNOSIS — R402113 Coma scale, eyes open, never, at hospital admission: Secondary | ICD-10-CM | POA: Diagnosis present

## 2017-01-27 DIAGNOSIS — Z978 Presence of other specified devices: Secondary | ICD-10-CM

## 2017-01-27 DIAGNOSIS — R6250 Unspecified lack of expected normal physiological development in childhood: Secondary | ICD-10-CM | POA: Diagnosis present

## 2017-01-27 DIAGNOSIS — F79 Unspecified intellectual disabilities: Secondary | ICD-10-CM | POA: Diagnosis present

## 2017-01-27 DIAGNOSIS — I469 Cardiac arrest, cause unspecified: Secondary | ICD-10-CM | POA: Diagnosis present

## 2017-01-27 DIAGNOSIS — Z529 Donor of unspecified organ or tissue: Secondary | ICD-10-CM

## 2017-01-27 DIAGNOSIS — G931 Anoxic brain damage, not elsewhere classified: Secondary | ICD-10-CM | POA: Diagnosis present

## 2017-01-27 LAB — PREPARE FRESH FROZEN PLASMA
UNIT DIVISION: 0
Unit division: 0

## 2017-01-27 LAB — I-STAT ARTERIAL BLOOD GAS, ED
ACID-BASE DEFICIT: 4 mmol/L — AB (ref 0.0–2.0)
Bicarbonate: 22.5 mmol/L (ref 20.0–28.0)
O2 Saturation: 100 %
PH ART: 7.289 — AB (ref 7.350–7.450)
PO2 ART: 437 mmHg — AB (ref 83.0–108.0)
TCO2: 24 mmol/L (ref 22–32)
pCO2 arterial: 46.9 mmHg (ref 32.0–48.0)

## 2017-01-27 LAB — GLUCOSE, CAPILLARY
GLUCOSE-CAPILLARY: 163 mg/dL — AB (ref 65–99)
Glucose-Capillary: 143 mg/dL — ABNORMAL HIGH (ref 65–99)

## 2017-01-27 LAB — I-STAT CHEM 8, ED
BUN: 19 mg/dL (ref 6–20)
CALCIUM ION: 1.06 mmol/L — AB (ref 1.15–1.40)
CHLORIDE: 105 mmol/L (ref 101–111)
Creatinine, Ser: 1.3 mg/dL — ABNORMAL HIGH (ref 0.44–1.00)
Glucose, Bld: 255 mg/dL — ABNORMAL HIGH (ref 65–99)
HCT: 41 % (ref 36.0–46.0)
Hemoglobin: 13.9 g/dL (ref 12.0–15.0)
POTASSIUM: 3.5 mmol/L (ref 3.5–5.1)
SODIUM: 143 mmol/L (ref 135–145)
TCO2: 20 mmol/L — ABNORMAL LOW (ref 22–32)

## 2017-01-27 LAB — ETHANOL: Alcohol, Ethyl (B): <10 mg/dL (ref <10)

## 2017-01-27 LAB — BPAM FFP
BLOOD PRODUCT EXPIRATION DATE: 201901272359
Blood Product Expiration Date: 201901252359
ISSUE DATE / TIME: 201901181006
ISSUE DATE / TIME: 201901181006
UNIT TYPE AND RH: 6200
Unit Type and Rh: 6200

## 2017-01-27 LAB — COMPREHENSIVE METABOLIC PANEL
ALBUMIN: 3.3 g/dL — AB (ref 3.5–5.0)
ALK PHOS: 160 U/L — AB (ref 38–126)
ALT: 127 U/L — ABNORMAL HIGH (ref 14–54)
ANION GAP: 25 — AB (ref 5–15)
AST: 165 U/L — ABNORMAL HIGH (ref 15–41)
BUN: 15 mg/dL (ref 6–20)
CALCIUM: 8.9 mg/dL (ref 8.9–10.3)
CO2: 15 mmol/L — AB (ref 22–32)
Chloride: 104 mmol/L (ref 101–111)
Creatinine, Ser: 1.65 mg/dL — ABNORMAL HIGH (ref 0.44–1.00)
GFR calc Af Amer: 41 mL/min — ABNORMAL LOW (ref >60)
GFR calc non Af Amer: 36 mL/min — ABNORMAL LOW (ref >60)
GLUCOSE: 265 mg/dL — AB (ref 65–99)
POTASSIUM: 3.6 mmol/L (ref 3.5–5.1)
SODIUM: 144 mmol/L (ref 135–145)
Total Bilirubin: 0.8 mg/dL (ref 0.3–1.2)
Total Protein: 7.7 g/dL (ref 6.5–8.1)

## 2017-01-27 LAB — URINALYSIS, ROUTINE W REFLEX MICROSCOPIC
BILIRUBIN URINE: NEGATIVE
Glucose, UA: 50 mg/dL — AB
KETONES UR: NEGATIVE mg/dL
Leukocytes, UA: NEGATIVE
Nitrite: NEGATIVE
Protein, ur: 100 mg/dL — AB
Specific Gravity, Urine: 1.02 (ref 1.005–1.030)
pH: 6 (ref 5.0–8.0)

## 2017-01-27 LAB — CBC
HEMATOCRIT: 41.9 % (ref 36.0–46.0)
Hemoglobin: 12.9 g/dL (ref 12.0–15.0)
MCH: 30.6 pg (ref 26.0–34.0)
MCHC: 30.8 g/dL (ref 30.0–36.0)
MCV: 99.5 fL (ref 78.0–100.0)
Platelets: 320 10*3/uL (ref 150–400)
RBC: 4.21 MIL/uL (ref 3.87–5.11)
RDW: 14.1 % (ref 11.5–15.5)
WBC: 17.5 10*3/uL — ABNORMAL HIGH (ref 4.0–10.5)

## 2017-01-27 LAB — ABO/RH: ABO/RH(D): A POS

## 2017-01-27 LAB — PROTIME-INR
INR: 1.15
Prothrombin Time: 14.6 seconds (ref 11.4–15.2)

## 2017-01-27 LAB — I-STAT CG4 LACTIC ACID, ED: LACTIC ACID, VENOUS: 15.58 mmol/L — AB (ref 0.5–1.9)

## 2017-01-27 LAB — LACTIC ACID, PLASMA: Lactic Acid, Venous: 2.5 mmol/L (ref 0.5–1.9)

## 2017-01-27 LAB — MRSA PCR SCREENING: MRSA by PCR: NEGATIVE

## 2017-01-27 MED ORDER — ONDANSETRON 4 MG PO TBDP: 4.0000 mg | ORAL_TABLET | Freq: Four times a day (QID) | ORAL | Status: DC | PRN

## 2017-01-27 MED ORDER — FENTANYL CITRATE (PF) 100 MCG/2ML IJ SOLN
100.0000 ug | INTRAMUSCULAR | Status: DC | PRN
  Administered 2017-01-27 (×2): 100 ug via INTRAVENOUS
  Filled 2017-01-27 (×2): qty 2

## 2017-01-27 MED ORDER — ACETAMINOPHEN 160 MG/5ML PO SOLN
650.0000 mg | Freq: Four times a day (QID) | ORAL | Status: DC | PRN
  Administered 2017-01-28 (×2): 650 mg
  Filled 2017-01-27 (×3): qty 20.3

## 2017-01-27 MED ORDER — ORAL CARE MOUTH RINSE
15.0000 mL | OROMUCOSAL | Status: DC
  Administered 2017-01-27 – 2017-02-01 (×47): 15 mL via OROMUCOSAL

## 2017-01-27 MED ORDER — CHLORHEXIDINE GLUCONATE 0.12% ORAL RINSE (MEDLINE KIT): 15.0000 mL | Freq: Two times a day (BID) | OROMUCOSAL | Status: DC

## 2017-01-27 MED ORDER — HYDRALAZINE HCL 20 MG/ML IJ SOLN
10.0000 mg | INTRAMUSCULAR | Status: DC | PRN
  Administered 2017-01-28 – 2017-01-31 (×3): 10 mg via INTRAVENOUS
  Filled 2017-01-27 (×3): qty 1

## 2017-01-27 MED ORDER — SODIUM CHLORIDE 0.9 % IV SOLN
INTRAVENOUS | Status: AC | PRN
  Administered 2017-01-27: 1000 mL via INTRAVENOUS

## 2017-01-27 MED ORDER — ORAL CARE MOUTH RINSE
15.0000 mL | Freq: Four times a day (QID) | OROMUCOSAL | Status: DC
  Administered 2017-01-27: 15 mL via OROMUCOSAL

## 2017-01-27 MED ORDER — FENTANYL CITRATE (PF) 100 MCG/2ML IJ SOLN
100.0000 ug | INTRAMUSCULAR | Status: DC | PRN
  Administered 2017-01-28 (×3): 100 ug via INTRAVENOUS
  Filled 2017-01-27 (×3): qty 2

## 2017-01-27 MED ORDER — PANTOPRAZOLE SODIUM 40 MG IV SOLR
40.0000 mg | Freq: Every day | INTRAVENOUS | Status: DC
  Administered 2017-01-27 – 2017-01-31 (×5): 40 mg via INTRAVENOUS
  Filled 2017-01-27 (×5): qty 40

## 2017-01-27 MED ORDER — HEPARIN SODIUM (PORCINE) 5000 UNIT/ML IJ SOLN
5000.0000 [IU] | Freq: Three times a day (TID) | INTRAMUSCULAR | Status: DC
  Administered 2017-01-27 – 2017-01-31 (×12): 5000 [IU] via SUBCUTANEOUS
  Filled 2017-01-27 (×12): qty 1

## 2017-01-27 MED ORDER — MIDAZOLAM HCL 2 MG/2ML IJ SOLN
2.0000 mg | INTRAMUSCULAR | Status: AC | PRN
  Administered 2017-01-27 – 2017-01-28 (×3): 2 mg via INTRAVENOUS
  Filled 2017-01-27 (×2): qty 2

## 2017-01-27 MED ORDER — ONDANSETRON HCL 4 MG/2ML IJ SOLN: 4.0000 mg | Freq: Four times a day (QID) | INTRAMUSCULAR | Status: DC | PRN

## 2017-01-27 MED ORDER — PIPERACILLIN-TAZOBACTAM 3.375 G IVPB
3.3750 g | Freq: Three times a day (TID) | INTRAVENOUS | Status: DC
  Administered 2017-01-27 – 2017-01-31 (×12): 3.375 g via INTRAVENOUS
  Filled 2017-01-27 (×13): qty 50

## 2017-01-27 MED ORDER — INSULIN ASPART 100 UNIT/ML ~~LOC~~ SOLN
0.0000 [IU] | SUBCUTANEOUS | Status: DC
  Administered 2017-01-27: 2 [IU] via SUBCUTANEOUS
  Administered 2017-01-27 – 2017-01-30 (×6): 1 [IU] via SUBCUTANEOUS
  Administered 2017-01-31: 2 [IU] via SUBCUTANEOUS
  Administered 2017-01-31: 1 [IU] via SUBCUTANEOUS

## 2017-01-27 MED ORDER — CHLORHEXIDINE GLUCONATE 0.12% ORAL RINSE (MEDLINE KIT)
15.0000 mL | Freq: Two times a day (BID) | OROMUCOSAL | Status: DC
  Administered 2017-01-27 – 2017-02-01 (×9): 15 mL via OROMUCOSAL

## 2017-01-27 MED ORDER — MIDAZOLAM HCL 2 MG/2ML IJ SOLN
2.0000 mg | INTRAMUSCULAR | Status: DC | PRN
  Filled 2017-01-27: qty 2

## 2017-01-27 MED ORDER — IOPAMIDOL (ISOVUE-300) INJECTION 61%
100.0000 mL | Freq: Once | INTRAVENOUS | Status: AC | PRN
Start: 2017-01-27 — End: 2017-01-27
  Administered 2017-01-27: 100 mL via INTRAVENOUS

## 2017-01-27 MED ORDER — PANTOPRAZOLE SODIUM 40 MG PO TBEC: 40.0000 mg | DELAYED_RELEASE_TABLET | Freq: Every day | ORAL | Status: DC

## 2017-01-27 MED ORDER — POTASSIUM CHLORIDE IN NACL 20-0.9 MEQ/L-% IV SOLN
INTRAVENOUS | Status: DC
  Administered 2017-01-27 – 2017-01-29 (×5): via INTRAVENOUS
  Filled 2017-01-27 (×7): qty 1000

## 2017-01-27 NOTE — ED Notes (Signed)
Report received from Sabrina at the bedside
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
## 2017-01-27 NOTE — ED Notes (Signed)
Pt had two more seizure like episodes, PRN medication given, patient is now relaxed and respirations are WNL.

## 2017-01-27 NOTE — ED Notes (Signed)
Urinary Catheter now has urine output.

## 2017-01-27 NOTE — ED Notes (Signed)
Pt going to CT

## 2017-01-27 NOTE — ED Notes (Signed)
Oxygen saturation dropped down to 82%. Oxygen turned from 40% to 60% and patient suctioned again by RT. Oxygen saturation is now 84%, O2 titrated up to 100% and suctioned once again, saturation then 87%, HOB elevated and saturation is now 92%. Remaining at bedside and will continue to monitor.

## 2017-01-27 NOTE — ED Notes (Signed)
Lactic acid must be redrawn per lab
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
## 2017-01-27 NOTE — ED Notes (Signed)
Lactic acid redrawn and sent to lab

## 2017-01-27 NOTE — ED Triage Notes (Signed)
See trauma narrator 

## 2017-01-27 NOTE — ED Notes (Signed)
Lactic 15.58 results given to Dr. Lindie SpruceWyatt and ordered for another fluid bolus.
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
## 2017-01-27 NOTE — ED Notes (Signed)
Pt is moving arms, and coughing over the tube O2 sats 87% on vent. PT suctioned by this RN and obtained copious amounts of dark yellow thick sputum. Dr. Lindie SpruceWyatt paged and respiratory at bedside adjusting oxygen levels.

## 2017-01-27 NOTE — ED Notes (Signed)
Pt had two episodes of seizure like activity lasting 5-7 seconds of convulsions all over and rolling her eyes back in her head, Dr. Lindie SpruceWyatt paged. Respiratory at bedside doing assessment.

## 2017-01-27 NOTE — Progress Notes (Signed)
Pt transported from ED to ICU on vent.  RT will continue to monitor.  

## 2017-01-27 NOTE — ED Notes (Addendum)
Dr. Jeraldine LootsLockwood at bedside and informed of seizure like activity and respirations 38 breathes per minute.
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
## 2017-01-27 NOTE — ED Notes (Signed)
rn remains at bedside 

## 2017-01-28 ENCOUNTER — Inpatient Hospital Stay (HOSPITAL_COMMUNITY): Payer: Medicaid Other

## 2017-01-28 LAB — COMPREHENSIVE METABOLIC PANEL
ALT: 113 U/L — AB (ref 14–54)
ANION GAP: 13 (ref 5–15)
AST: 130 U/L — ABNORMAL HIGH (ref 15–41)
Albumin: 2.9 g/dL — ABNORMAL LOW (ref 3.5–5.0)
Alkaline Phosphatase: 151 U/L — ABNORMAL HIGH (ref 38–126)
BUN: 16 mg/dL (ref 6–20)
CO2: 20 mmol/L — AB (ref 22–32)
CREATININE: 0.92 mg/dL (ref 0.44–1.00)
Calcium: 7.6 mg/dL — ABNORMAL LOW (ref 8.9–10.3)
Chloride: 111 mmol/L (ref 101–111)
Glucose, Bld: 141 mg/dL — ABNORMAL HIGH (ref 65–99)
POTASSIUM: 3.6 mmol/L (ref 3.5–5.1)
SODIUM: 144 mmol/L (ref 135–145)
Total Bilirubin: 0.8 mg/dL (ref 0.3–1.2)
Total Protein: 7.1 g/dL (ref 6.5–8.1)

## 2017-01-28 LAB — CBC
HEMATOCRIT: 36.2 % (ref 36.0–46.0)
Hemoglobin: 11.7 g/dL — ABNORMAL LOW (ref 12.0–15.0)
MCH: 30.1 pg (ref 26.0–34.0)
MCHC: 32.3 g/dL (ref 30.0–36.0)
MCV: 93.1 fL (ref 78.0–100.0)
PLATELETS: 223 10*3/uL (ref 150–400)
RBC: 3.89 MIL/uL (ref 3.87–5.11)
RDW: 14 % (ref 11.5–15.5)
WBC: 19.2 10*3/uL — ABNORMAL HIGH (ref 4.0–10.5)

## 2017-01-28 LAB — GLUCOSE, CAPILLARY
GLUCOSE-CAPILLARY: 110 mg/dL — AB (ref 65–99)
GLUCOSE-CAPILLARY: 115 mg/dL — AB (ref 65–99)
GLUCOSE-CAPILLARY: 130 mg/dL — AB (ref 65–99)
GLUCOSE-CAPILLARY: 135 mg/dL — AB (ref 65–99)
Glucose-Capillary: 126 mg/dL — ABNORMAL HIGH (ref 65–99)
Glucose-Capillary: 134 mg/dL — ABNORMAL HIGH (ref 65–99)

## 2017-01-28 LAB — CDS SEROLOGY

## 2017-01-28 LAB — HIV ANTIBODY (ROUTINE TESTING W REFLEX): HIV SCREEN 4TH GENERATION: NONREACTIVE

## 2017-01-28 LAB — LACTIC ACID, PLASMA: Lactic Acid, Venous: 1.2 mmol/L (ref 0.5–1.9)

## 2017-01-28 MED ORDER — METOPROLOL TARTRATE 5 MG/5ML IV SOLN
10.0000 mg | Freq: Four times a day (QID) | INTRAVENOUS | Status: DC | PRN
  Administered 2017-01-28 (×2): 10 mg via INTRAVENOUS
  Filled 2017-01-28 (×2): qty 10
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
## 2017-01-28 NOTE — Progress Notes (Signed)
STAT EEG completed, results pending. Dr Otelia LimesLindzen notified.
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
## 2017-01-28 NOTE — Progress Notes (Signed)
CSW acknowledging consult for patient for mental disability. Patient is currently sedated and on vent. Patient's mother, Ella JubileeMary Wells is currently on 2C who [facility]. CSW will continue to follow.  Edwin Dadaarol Demetrice Combes, MSW, LCSW-A Weekend Clinical Social Worker [phone]

## 2017-01-28 NOTE — Progress Notes (Signed)
Called by RN that patient's BP had been increasing and she had been having some Cheyne Stokes breathing pattern.  I s/w the pt's mother and d/w her the results of the MRI this evening.  Her mother states that she would like to make her DNR.  All questions were answered.

## 2017-01-29 ENCOUNTER — Inpatient Hospital Stay (HOSPITAL_COMMUNITY): Payer: Medicaid Other

## 2017-01-29 LAB — CBC WITH DIFFERENTIAL/PLATELET
BASOS PCT: 0 %
Basophils Absolute: 0 10*3/uL (ref 0.0–0.1)
EOS ABS: 0 10*3/uL (ref 0.0–0.7)
Eosinophils Relative: 0 %
HCT: 30.5 % — ABNORMAL LOW (ref 36.0–46.0)
HEMOGLOBIN: 9.8 g/dL — AB (ref 12.0–15.0)
Lymphocytes Relative: 10 %
Lymphs Abs: 1.4 10*3/uL (ref 0.7–4.0)
MCH: 30.1 pg (ref 26.0–34.0)
MCHC: 32.1 g/dL (ref 30.0–36.0)
MCV: 93.6 fL (ref 78.0–100.0)
MONOS PCT: 11 %
Monocytes Absolute: 1.5 10*3/uL — ABNORMAL HIGH (ref 0.1–1.0)
NEUTROS PCT: 79 %
Neutro Abs: 11.2 10*3/uL — ABNORMAL HIGH (ref 1.7–7.7)
PLATELETS: 215 10*3/uL (ref 150–400)
RBC: 3.26 MIL/uL — AB (ref 3.87–5.11)
RDW: 14.3 % (ref 11.5–15.5)
WBC: 14.1 10*3/uL — AB (ref 4.0–10.5)

## 2017-01-29 LAB — BPAM RBC
BLOOD PRODUCT EXPIRATION DATE: 201901262359
Blood Product Expiration Date: 201901262359
ISSUE DATE / TIME: 201901181506
ISSUE DATE / TIME: 201901182123
UNIT TYPE AND RH: 9500
Unit Type and Rh: 9500

## 2017-01-29 LAB — TYPE AND SCREEN
ABO/RH(D): A POS
ANTIBODY SCREEN: NEGATIVE
PT AG Type: POSITIVE
UNIT DIVISION: 0
Unit division: 0

## 2017-01-29 LAB — GLUCOSE, CAPILLARY
GLUCOSE-CAPILLARY: 109 mg/dL — AB (ref 65–99)
GLUCOSE-CAPILLARY: 111 mg/dL — AB (ref 65–99)
GLUCOSE-CAPILLARY: 103 mg/dL — AB (ref 65–99)
Glucose-Capillary: 109 mg/dL — ABNORMAL HIGH (ref 65–99)
Glucose-Capillary: 119 mg/dL — ABNORMAL HIGH (ref 65–99)
Glucose-Capillary: 104 mg/dL — ABNORMAL HIGH (ref 65–99)

## 2017-01-29 LAB — ABO/RH
ABO/RH(D): A POS
PT AG Type: POSITIVE

## 2017-01-29 LAB — BASIC METABOLIC PANEL
Anion gap: 12 (ref 5–15)
BUN: 15 mg/dL (ref 6–20)
CHLORIDE: 119 mmol/L — AB (ref 101–111)
CO2: 18 mmol/L — ABNORMAL LOW (ref 22–32)
Calcium: 6.9 mg/dL — ABNORMAL LOW (ref 8.9–10.3)
Creatinine, Ser: 0.72 mg/dL (ref 0.44–1.00)
Glucose, Bld: 122 mg/dL — ABNORMAL HIGH (ref 65–99)
Potassium: 3.3 mmol/L — ABNORMAL LOW (ref 3.5–5.1)
SODIUM: 149 mmol/L — AB (ref 135–145)

## 2017-01-29 LAB — URINE CULTURE: Culture: NO GROWTH

## 2017-01-29 MED ORDER — ALBUMIN HUMAN 5 % IV SOLN
25.0000 g | Freq: Once | INTRAVENOUS | Status: AC
  Administered 2017-01-29: 25 g via INTRAVENOUS
  Filled 2017-01-29: qty 500

## 2017-01-29 MED ORDER — SODIUM CHLORIDE 0.9 % IV BOLUS (SEPSIS)
500.0000 mL | Freq: Once | INTRAVENOUS | Status: AC
  Administered 2017-01-29: 500 mL via INTRAVENOUS

## 2017-01-29 MED ORDER — NOREPINEPHRINE BITARTRATE 1 MG/ML IV SOLN
0.0000 ug/min | INTRAVENOUS | Status: DC
  Administered 2017-01-29: 2 ug/min via INTRAVENOUS
  Filled 2017-01-29: qty 4

## 2017-01-29 MED ORDER — SODIUM CHLORIDE 0.9 % IV BOLUS (SEPSIS)
1000.0000 mL | Freq: Once | INTRAVENOUS | Status: AC
  Administered 2017-01-29: 1000 mL via INTRAVENOUS

## 2017-01-29 MED ORDER — TECHNETIUM TC 99M EXAMETAZIME IV KIT
20.0000 | PACK | Freq: Once | INTRAVENOUS | Status: AC | PRN
  Administered 2017-01-29: 20 via INTRAVENOUS

## 2017-01-29 NOTE — Progress Notes (Signed)
The patient has progressed to signs of brain death.  No corneal reflexes, no gag reflex, no cough reflex, Pupils are midrange and nonreactive.Marland Kitchen.  However this is not enough to declare her brain dead.  Will need nuclear medicine flow study.  Discussed briefly with the patient's cousin. Marta LamasJames O. Gae BonWyatt, III, MD, FACS [phone] Trauma Surgeon

## 2017-01-29 NOTE — Progress Notes (Signed)
Patient transported on vent from 4N28 to Nuc Med and back. No complications noted. Vitals stable throughout.

## 2017-01-30 LAB — CBC WITH DIFFERENTIAL/PLATELET
Basophils Absolute: 0 10*3/uL (ref 0.0–0.1)
Basophils Relative: 0 %
EOS PCT: 0 %
Eosinophils Absolute: 0 10*3/uL (ref 0.0–0.7)
HEMATOCRIT: 32.4 % — AB (ref 36.0–46.0)
Hemoglobin: 10.4 g/dL — ABNORMAL LOW (ref 12.0–15.0)
LYMPHS ABS: 1.4 10*3/uL (ref 0.7–4.0)
LYMPHS PCT: 14 %
MCH: 30.2 pg (ref 26.0–34.0)
MCHC: 32.1 g/dL (ref 30.0–36.0)
MCV: 94.2 fL (ref 78.0–100.0)
MONOS PCT: 12 %
Monocytes Absolute: 1.2 10*3/uL — ABNORMAL HIGH (ref 0.1–1.0)
Neutro Abs: 7.5 10*3/uL (ref 1.7–7.7)
Neutrophils Relative %: 74 %
Platelets: 216 10*3/uL (ref 150–400)
RBC: 3.44 MIL/uL — AB (ref 3.87–5.11)
RDW: 15 % (ref 11.5–15.5)
WBC: 10.1 10*3/uL (ref 4.0–10.5)

## 2017-01-30 LAB — COMPREHENSIVE METABOLIC PANEL
ALT: 57 U/L — AB (ref 14–54)
AST: 98 U/L — AB (ref 15–41)
Albumin: 2.7 g/dL — ABNORMAL LOW (ref 3.5–5.0)
Alkaline Phosphatase: 108 U/L (ref 38–126)
BUN: 11 mg/dL (ref 6–20)
CO2: 21 mmol/L — ABNORMAL LOW (ref 22–32)
CREATININE: 0.86 mg/dL (ref 0.44–1.00)
Calcium: 8 mg/dL — ABNORMAL LOW (ref 8.9–10.3)
GFR calc Af Amer: >60 mL/min (ref >60)
Glucose, Bld: 113 mg/dL — ABNORMAL HIGH (ref 65–99)
Potassium: 3.5 mmol/L (ref 3.5–5.1)
Sodium: 163 mmol/L (ref 135–145)
Total Bilirubin: 0.7 mg/dL (ref 0.3–1.2)
Total Protein: 6.7 g/dL (ref 6.5–8.1)

## 2017-01-30 LAB — GLUCOSE, CAPILLARY
GLUCOSE-CAPILLARY: 104 mg/dL — AB (ref 65–99)
GLUCOSE-CAPILLARY: 111 mg/dL — AB (ref 65–99)
GLUCOSE-CAPILLARY: 129 mg/dL — AB (ref 65–99)
Glucose-Capillary: 102 mg/dL — ABNORMAL HIGH (ref 65–99)
Glucose-Capillary: 100 mg/dL — ABNORMAL HIGH (ref 65–99)
Glucose-Capillary: 117 mg/dL — ABNORMAL HIGH (ref 65–99)

## 2017-01-30 LAB — BASIC METABOLIC PANEL
Anion gap: 10 (ref 5–15)
BUN: 13 mg/dL (ref 6–20)
CALCIUM: 8 mg/dL — AB (ref 8.9–10.3)
CO2: 20 mmol/L — ABNORMAL LOW (ref 22–32)
CREATININE: 0.75 mg/dL (ref 0.44–1.00)
Chloride: 129 mmol/L — ABNORMAL HIGH (ref 101–111)
GFR calc Af Amer: >60 mL/min (ref >60)
GLUCOSE: 108 mg/dL — AB (ref 65–99)
Potassium: 3.4 mmol/L — ABNORMAL LOW (ref 3.5–5.1)
SODIUM: 159 mmol/L — AB (ref 135–145)

## 2017-01-30 MED ORDER — DESMOPRESSIN ACETATE 4 MCG/ML IJ SOLN
0.5000 ug | Freq: Two times a day (BID) | INTRAMUSCULAR | Status: DC
  Administered 2017-01-30 (×2): 0.52 ug via INTRAVENOUS
  Filled 2017-01-30: qty 0.13
  Filled 2017-01-30: qty 1
  Filled 2017-01-30: qty 0.13

## 2017-01-30 MED ORDER — SODIUM CHLORIDE 0.45 % IV SOLN
INTRAVENOUS | Status: DC
  Administered 2017-01-30 – 2017-01-31 (×2): via INTRAVENOUS

## 2017-01-30 NOTE — Progress Notes (Signed)
CRITICAL VALUE ALERT  Critical Value:  Na of 161 Cl >130  Date & Time Notied: 01/30/17 0540  Provider Notified: Marin Olphristopher White  Orders Received/Actions taken: DDAVp, change fluid to 1/2 NS and repeat BMP at 1000.
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
## 2017-01-31 ENCOUNTER — Other Ambulatory Visit (HOSPITAL_COMMUNITY): Payer: Medicaid Other

## 2017-01-31 ENCOUNTER — Ambulatory Visit (HOSPITAL_COMMUNITY): Admission: EM | Disposition: E | Payer: Self-pay | Source: Home / Self Care

## 2017-01-31 ENCOUNTER — Inpatient Hospital Stay (HOSPITAL_COMMUNITY): Payer: Medicaid Other

## 2017-01-31 DIAGNOSIS — Z529 Donor of unspecified organ or tissue: Secondary | ICD-10-CM

## 2017-01-31 DIAGNOSIS — I361 Nonrheumatic tricuspid (valve) insufficiency: Secondary | ICD-10-CM

## 2017-01-31 DIAGNOSIS — J9601 Acute respiratory failure with hypoxia: Secondary | ICD-10-CM

## 2017-01-31 HISTORY — PX: RIGHT/LEFT HEART CATH AND CORONARY ANGIOGRAPHY: CATH118266

## 2017-01-31 LAB — POCT I-STAT 3, ART BLOOD GAS (G3+)
ACID-BASE EXCESS: 1 mmol/L (ref 0.0–2.0)
ACID-BASE EXCESS: 1 mmol/L (ref 0.0–2.0)
Acid-Base Excess: 1 mmol/L (ref 0.0–2.0)
Acid-Base Excess: 4 mmol/L — ABNORMAL HIGH (ref 0.0–2.0)
Acid-base deficit: 1 mmol/L (ref 0.0–2.0)
BICARBONATE: 26.4 mmol/L (ref 20.0–28.0)
Bicarbonate: 25.8 mmol/L (ref 20.0–28.0)
Bicarbonate: 27.8 mmol/L (ref 20.0–28.0)
Bicarbonate: 24.3 mmol/L (ref 20.0–28.0)
Bicarbonate: 26.5 mmol/L (ref 20.0–28.0)
O2 SAT: 100 %
O2 Saturation: 97 %
O2 Saturation: 100 %
O2 Saturation: 95 %
O2 Saturation: 100 %
PCO2 ART: 38.3 mmHg (ref 32.0–48.0)
PH ART: 7.4 (ref 7.350–7.450)
PH ART: 7.465 — AB (ref 7.350–7.450)
PH ART: 7.36 (ref 7.350–7.450)
PH ART: 7.338 — AB (ref 7.350–7.450)
Patient temperature: 97
TCO2: 27 mmol/L (ref 22–32)
TCO2: 29 mmol/L (ref 22–32)
TCO2: 25 mmol/L (ref 22–32)
TCO2: 28 mmol/L (ref 22–32)
TCO2: 28 mmol/L (ref 22–32)
pCO2 arterial: 41.7 mmHg (ref 32.0–48.0)
pCO2 arterial: 37.5 mmHg (ref 32.0–48.0)
pCO2 arterial: 46.9 mmHg (ref 32.0–48.0)
pCO2 arterial: 48.9 mmHg — ABNORMAL HIGH (ref 32.0–48.0)
pH, Arterial: 7.415 (ref 7.350–7.450)
pO2, Arterial: 94 mmHg (ref 83.0–108.0)
pO2, Arterial: 382 mmHg — ABNORMAL HIGH (ref 83.0–108.0)
pO2, Arterial: 72 mmHg — ABNORMAL LOW (ref 83.0–108.0)
pO2, Arterial: 422 mmHg — ABNORMAL HIGH (ref 83.0–108.0)
pO2, Arterial: 344 mmHg — ABNORMAL HIGH (ref 83.0–108.0)

## 2017-01-31 LAB — BASIC METABOLIC PANEL
BUN: 10 mg/dL (ref 6–20)
BUN: 9 mg/dL (ref 6–20)
CALCIUM: 8.9 mg/dL (ref 8.9–10.3)
CALCIUM: 7.9 mg/dL — AB (ref 8.9–10.3)
CO2: 23 mmol/L (ref 22–32)
CO2: 22 mmol/L (ref 22–32)
Chloride: >130 mmol/L (ref 101–111)
Chloride: >130 mmol/L (ref 101–111)
Creatinine, Ser: 0.78 mg/dL (ref 0.44–1.00)
Creatinine, Ser: 0.8 mg/dL (ref 0.44–1.00)
GLUCOSE: 305 mg/dL — AB (ref 65–99)
Glucose, Bld: 148 mg/dL — ABNORMAL HIGH (ref 65–99)
POTASSIUM: 3.2 mmol/L — AB (ref 3.5–5.1)
POTASSIUM: 2.9 mmol/L — AB (ref 3.5–5.1)
SODIUM: 172 mmol/L — AB (ref 135–145)
SODIUM: 166 mmol/L — AB (ref 135–145)

## 2017-01-31 LAB — PHOSPHORUS: PHOSPHORUS: 1.8 mg/dL — AB (ref 2.5–4.6)

## 2017-01-31 LAB — POCT I-STAT 3, VENOUS BLOOD GAS (G3P V)
Acid-base deficit: 2 mmol/L (ref 0.0–2.0)
Bicarbonate: 24 mmol/L (ref 20.0–28.0)
O2 SAT: 82 %
PCO2 VEN: 47.2 mmHg (ref 44.0–60.0)
PH VEN: 7.315 (ref 7.250–7.430)
PO2 VEN: 51 mmHg — AB (ref 32.0–45.0)
TCO2: 25 mmol/L (ref 22–32)

## 2017-01-31 LAB — DIFFERENTIAL
BASOS PCT: 0 %
Basophils Absolute: 0.1 10*3/uL (ref 0.0–0.1)
EOS ABS: 0 10*3/uL (ref 0.0–0.7)
EOS PCT: 0 %
Lymphocytes Relative: 15 %
Lymphs Abs: 1.6 10*3/uL (ref 0.7–4.0)
MONO ABS: 1.9 10*3/uL — AB (ref 0.1–1.0)
MONOS PCT: 17 %
Neutro Abs: 7.8 10*3/uL — ABNORMAL HIGH (ref 1.7–7.7)
Neutrophils Relative %: 68 %

## 2017-01-31 LAB — URINALYSIS, COMPLETE (UACMP) WITH MICROSCOPIC
BILIRUBIN URINE: NEGATIVE
GLUCOSE, UA: NEGATIVE mg/dL
KETONES UR: NEGATIVE mg/dL
LEUKOCYTES UA: NEGATIVE
Nitrite: NEGATIVE
PH: 5 (ref 5.0–8.0)
PROTEIN: NEGATIVE mg/dL
SQUAMOUS EPITHELIAL / LPF: NONE SEEN
Specific Gravity, Urine: 1.009 (ref 1.005–1.030)

## 2017-01-31 LAB — COMPREHENSIVE METABOLIC PANEL
ALT: 71 U/L — ABNORMAL HIGH (ref 14–54)
AST: 81 U/L — ABNORMAL HIGH (ref 15–41)
Albumin: 2.5 g/dL — ABNORMAL LOW (ref 3.5–5.0)
Alkaline Phosphatase: 121 U/L (ref 38–126)
BILIRUBIN TOTAL: 1.2 mg/dL (ref 0.3–1.2)
BUN: 9 mg/dL (ref 6–20)
CO2: 25 mmol/L (ref 22–32)
Calcium: 8.7 mg/dL — ABNORMAL LOW (ref 8.9–10.3)
Creatinine, Ser: 0.9 mg/dL (ref 0.44–1.00)
Glucose, Bld: 157 mg/dL — ABNORMAL HIGH (ref 65–99)
POTASSIUM: 2.9 mmol/L — AB (ref 3.5–5.1)
Sodium: 173 mmol/L (ref 135–145)
Total Protein: 6.8 g/dL (ref 6.5–8.1)

## 2017-01-31 LAB — GLUCOSE, CAPILLARY
GLUCOSE-CAPILLARY: 124 mg/dL — AB (ref 65–99)
GLUCOSE-CAPILLARY: 133 mg/dL — AB (ref 65–99)
GLUCOSE-CAPILLARY: 160 mg/dL — AB (ref 65–99)
GLUCOSE-CAPILLARY: 225 mg/dL — AB (ref 65–99)
GLUCOSE-CAPILLARY: 255 mg/dL — AB (ref 65–99)
GLUCOSE-CAPILLARY: 259 mg/dL — AB (ref 65–99)
GLUCOSE-CAPILLARY: 287 mg/dL — AB (ref 65–99)
Glucose-Capillary: 140 mg/dL — ABNORMAL HIGH (ref 65–99)
Glucose-Capillary: 172 mg/dL — ABNORMAL HIGH (ref 65–99)

## 2017-01-31 LAB — CBC
HEMATOCRIT: 35.9 % — AB (ref 36.0–46.0)
HEMATOCRIT: 32.6 % — AB (ref 36.0–46.0)
Hemoglobin: 11.5 g/dL — ABNORMAL LOW (ref 12.0–15.0)
Hemoglobin: 10.7 g/dL — ABNORMAL LOW (ref 12.0–15.0)
MCH: 30 pg (ref 26.0–34.0)
MCH: 30.8 pg (ref 26.0–34.0)
MCHC: 32 g/dL (ref 30.0–36.0)
MCHC: 32.8 g/dL (ref 30.0–36.0)
MCV: 93.7 fL (ref 78.0–100.0)
MCV: 93.9 fL (ref 78.0–100.0)
Platelets: 253 10*3/uL (ref 150–400)
Platelets: 257 10*3/uL (ref 150–400)
RBC: 3.83 MIL/uL — AB (ref 3.87–5.11)
RBC: 3.47 MIL/uL — ABNORMAL LOW (ref 3.87–5.11)
RDW: 15.2 % (ref 11.5–15.5)
RDW: 15.1 % (ref 11.5–15.5)
WBC: 9.1 10*3/uL (ref 4.0–10.5)
WBC: 11.3 10*3/uL — ABNORMAL HIGH (ref 4.0–10.5)

## 2017-01-31 LAB — CK TOTAL AND CKMB (NOT AT ARMC)
CK TOTAL: 321 U/L — AB (ref 38–234)
CK, MB: 3.1 ng/mL (ref 0.5–5.0)
Relative Index: 1 (ref 0.0–2.5)

## 2017-01-31 LAB — HEMOGLOBIN A1C
Hgb A1c MFr Bld: 5.5 % (ref 4.8–5.6)
Mean Plasma Glucose: 111.15 mg/dL

## 2017-01-31 LAB — LACTIC ACID, PLASMA: LACTIC ACID, VENOUS: 1.4 mmol/L (ref 0.5–1.9)

## 2017-01-31 LAB — APTT: aPTT: 40 s — ABNORMAL HIGH (ref 24–36)

## 2017-01-31 LAB — ECHOCARDIOGRAM COMPLETE
HEIGHTINCHES: 65 in
WEIGHTICAEL: 3477.98 [oz_av]

## 2017-01-31 LAB — BILIRUBIN, DIRECT: Bilirubin, Direct: 0.5 mg/dL (ref 0.1–0.5)

## 2017-01-31 LAB — LIPASE, BLOOD: LIPASE: 124 U/L — AB (ref 11–51)

## 2017-01-31 LAB — PREGNANCY, URINE: PREG TEST UR: NEGATIVE

## 2017-01-31 LAB — TROPONIN I: TROPONIN I: 0.14 ng/mL — AB (ref <0.03)

## 2017-01-31 LAB — PROTIME-INR
INR: 1.28
Prothrombin Time: 15.9 s — ABNORMAL HIGH (ref 11.4–15.2)

## 2017-01-31 LAB — MAGNESIUM: Magnesium: 3 mg/dL — ABNORMAL HIGH (ref 1.7–2.4)

## 2017-01-31 LAB — LACTATE DEHYDROGENASE: LDH: 367 U/L — ABNORMAL HIGH (ref 98–192)

## 2017-01-31 LAB — AMYLASE: Amylase: 118 U/L — ABNORMAL HIGH (ref 28–100)

## 2017-01-31 SURGERY — RIGHT/LEFT HEART CATH AND CORONARY ANGIOGRAPHY
Anesthesia: LOCAL

## 2017-01-31 MED ORDER — ONDANSETRON HCL 4 MG/2ML IJ SOLN: 4.0000 mg | Freq: Four times a day (QID) | INTRAMUSCULAR | Status: DC | PRN

## 2017-01-31 MED ORDER — LIDOCAINE HCL (PF) 1 % IJ SOLN
INTRAMUSCULAR | Status: AC
  Filled 2017-01-31: qty 30

## 2017-01-31 MED ORDER — HEPARIN (PORCINE) IN NACL 2-0.9 UNIT/ML-% IJ SOLN
INTRAMUSCULAR | Status: AC
  Filled 2017-01-31: qty 1000

## 2017-01-31 MED ORDER — TECHNETIUM TC 99M EXAMETAZIME IV KIT
20.2000 | PACK | Freq: Once | INTRAVENOUS | Status: AC | PRN
  Administered 2017-01-31: 20.2 via INTRAVENOUS

## 2017-01-31 MED ORDER — DEXTROSE 5 % IV SOLN
INTRAVENOUS | Status: DC
  Administered 2017-01-31 – 2017-02-01 (×2): via INTRAVENOUS

## 2017-01-31 MED ORDER — HEPARIN (PORCINE) IN NACL 2-0.9 UNIT/ML-% IJ SOLN
INTRAMUSCULAR | Status: AC | PRN
  Administered 2017-01-31 (×2): 1000 mL

## 2017-01-31 MED ORDER — SODIUM CHLORIDE 0.9 % IV SOLN
INTRAVENOUS | Status: DC
  Administered 2017-01-31: 2 [IU]/h via INTRAVENOUS
  Filled 2017-01-31: qty 1

## 2017-01-31 MED ORDER — SODIUM CHLORIDE 0.9 % IV SOLN
1000.0000 mg | INTRAVENOUS | Status: AC
  Administered 2017-01-31: 1000 mg via INTRAVENOUS
  Filled 2017-01-31: qty 8

## 2017-01-31 MED ORDER — IOPAMIDOL (ISOVUE-370) INJECTION 76%
INTRAVENOUS | Status: DC | PRN
  Administered 2017-01-31: 60 mL via INTRA_ARTERIAL

## 2017-01-31 MED ORDER — LABETALOL HCL 5 MG/ML IV SOLN
5.0000 mg | INTRAVENOUS | Status: AC
  Administered 2017-01-31: 5 mg via INTRAVENOUS

## 2017-01-31 MED ORDER — DEXTROSE 5 % IV SOLN
INTRAVENOUS | Status: DC
  Administered 2017-01-31: 07:00:00 via INTRAVENOUS

## 2017-01-31 MED ORDER — POTASSIUM PHOSPHATES 15 MMOLE/5ML IV SOLN
30.0000 mmol | Freq: Once | INTRAVENOUS | Status: AC
  Administered 2017-01-31: 30 mmol via INTRAVENOUS
  Filled 2017-01-31 (×2): qty 10

## 2017-01-31 MED ORDER — INSULIN ASPART 100 UNIT/ML ~~LOC~~ SOLN
10.0000 [IU] | Freq: Once | SUBCUTANEOUS | Status: AC
  Administered 2017-01-31: 10 [IU] via SUBCUTANEOUS

## 2017-01-31 MED ORDER — INSULIN ASPART 100 UNIT/ML ~~LOC~~ SOLN
0.0000 [IU] | SUBCUTANEOUS | Status: DC
  Administered 2017-01-31: 2 [IU] via SUBCUTANEOUS
  Administered 2017-01-31 (×2): 1 [IU] via SUBCUTANEOUS

## 2017-01-31 MED ORDER — IOPAMIDOL (ISOVUE-370) INJECTION 76%
INTRAVENOUS | Status: AC
  Filled 2017-01-31: qty 125

## 2017-01-31 MED ORDER — VANCOMYCIN HCL 10 G IV SOLR
1500.0000 mg | Freq: Two times a day (BID) | INTRAVENOUS | Status: DC
  Administered 2017-02-01: 1500 mg via INTRAVENOUS
  Filled 2017-01-31 (×2): qty 1500

## 2017-01-31 MED ORDER — SODIUM CHLORIDE 0.9 % IV SOLN
INTRAVENOUS | Status: DC | PRN
  Administered 2017-02-01: 13:00:00 via INTRA_ARTERIAL

## 2017-01-31 MED ORDER — VANCOMYCIN HCL 10 G IV SOLR: 1500.0000 mg | Freq: Two times a day (BID) | INTRAVENOUS | Status: DC

## 2017-01-31 MED ORDER — VANCOMYCIN HCL 10 G IV SOLR
1500.0000 mg | Freq: Once | INTRAVENOUS | Status: AC
  Administered 2017-01-31: 1500 mg via INTRAVENOUS
  Filled 2017-01-31: qty 1500

## 2017-01-31 MED ORDER — LIDOCAINE HCL (PF) 1 % IJ SOLN
INTRAMUSCULAR | Status: DC | PRN
  Administered 2017-01-31: 12 mL

## 2017-01-31 MED ORDER — LABETALOL HCL 5 MG/ML IV SOLN
INTRAVENOUS | Status: AC
  Filled 2017-01-31: qty 4

## 2017-01-31 SURGICAL SUPPLY — 10 items
CATH INFINITI 5FR MULTPACK ANG (CATHETERS) ×2 IMPLANT
CATH SWAN GANZ 7F STRAIGHT (CATHETERS) ×2 IMPLANT
KIT HEART LEFT (KITS) ×2 IMPLANT
PACK CARDIAC CATHETERIZATION (CUSTOM PROCEDURE TRAY) ×2 IMPLANT
SHEATH PINNACLE 5F 10CM (SHEATH) ×2 IMPLANT
SHEATH PINNACLE 7F 10CM (SHEATH) ×2 IMPLANT
SYR MEDRAD MARK V 150ML (SYRINGE) ×2 IMPLANT
TRANSDUCER W/STOPCOCK (MISCELLANEOUS) ×2 IMPLANT
TUBING CIL FLEX 10 FLL-RA (TUBING) ×2 IMPLANT
WIRE EMERALD 3MM-J .035X150CM (WIRE) ×2 IMPLANT

## 2017-01-31 NOTE — Progress Notes (Signed)
RT attempted Arterial Line placement x 2 without success. MD aware.

## 2017-01-31 NOTE — Procedures (Signed)
Bronchoscopy Procedure Note Lauren Farmer [id number] [birth year]  Procedure: Bronchoscopy Indications: Diagnostic evaluation of the airways, Obtain specimens for culture and/or other diagnostic studies and Remove secretions  Procedure Details Consent: Unable to obtain consent because of altered level of consciousness. Time Out: Verified patient identification, verified procedure, site/side was marked, verified correct patient position, special equipment/implants available, medications/allergies/relevent history reviewed, required imaging and test results available.  Performed  In preparation for procedure, patient was given 100% FiO2 and bronchoscope lubricated. Sedation: None, patient is brain dead  Airway entered and the following bronchi were examined: RUL, RML, RLL, LUL, LLL and Bronchi.   BAL from LUL, purulent material there that cleared easily with no endobronchial abnormalities noted Bronchoscope removed.  , Patient placed back on 100% FiO2 at conclusion of procedure.    Evaluation Hemodynamic Status: BP stable throughout; O2 sats: stable throughout Patient's Current Condition: stable Specimens:  Sent purulent fluid Complications: No apparent complications Patient did tolerate procedure well.   Jennet Maduro 01/23/2017

## 2017-01-31 NOTE — Progress Notes (Addendum)
The patient's clinical examination with no corneal reflexes, nor pupillary reaction, no gag, no cough, no overbreathing on the ventilator, no movement, no reflexes are consistent with brain death.  Brain flow study just came back as no flow.  Patient pronounced brain dead at 10:45 AM.  Marta LamasJames O. Gae BonWyatt, III, MD, FACS [phone] Trauma Surgeon

## 2017-01-31 NOTE — Progress Notes (Signed)
RR decreased due to ph of 7.46 on prior ABG.

## 2017-01-31 NOTE — Progress Notes (Signed)
Patient was transported to CATH lab and back to 4N28 on the ventilator with no complications.

## 2017-01-31 NOTE — Progress Notes (Signed)
  Echocardiogram 2D Echocardiogram has been performed.  Leta JunglingCooper, Anjolie Majer M 03-02-2017, 2:57 PM

## 2017-01-31 NOTE — Progress Notes (Signed)
CRITICAL VALUE ALERT  Critical Value:  Na 172 and Cl >130  Date & Time Notied:  03-Jan-2018 @ 0625  Provider Notified: on-call trauma MD - Janee Mornhompson  Orders Received/Actions taken: MD informed RN to start a D5W gtt @ 1850mL/hr and to stop the current infusion of 0.45% saline.  Will continue to monitor.  Francia GreavesSavannah R Ziva Nunziata, RN

## 2017-01-31 NOTE — Progress Notes (Signed)
OETT retracted 2 cm per verbal order from Dr. Molli KnockYacoub post bronchoscopy at bedside.

## 2017-01-31 NOTE — Procedures (Signed)
Central Venous Catheter Insertion Procedure Note Lauren BlockMary M Farmer [id number] 12/15/1965  Procedure: Insertion of Central Venous Catheter Indications: Drug and/or fluid administration  Provider: Dr. Jimmye NormanJames Wyatt, MD & Lynden OxfordZachary Rodrick Payson, PA-S  Procedure Details Consent: Risks of procedure as well as the alternatives and risks of each were explained to the caregiver.  Consent for procedure obtained. Time Out: Verified patient identification, verified procedure, site/side was marked, verified correct patient position, special equipment/implants available, medications/allergies/relevent history reviewed, required imaging and test results available.  Performed  Maximum sterile technique was used including antiseptics, cap, gloves, gown, hand hygiene, mask and sheet. Skin prep: Chlorhexidine; local anesthetic administered A antimicrobial bonded/coated triple lumen catheter was placed in the right subclavian vein using the Seldinger technique.  Evaluation Blood flow good Complications: No apparent complications Patient did tolerate procedure well. Chest X-ray ordered to verify placement.  CXR: pending.  Signed Lynden OxfordZachary Jabril Pursell , PA-S [facility] 03-18-2017, 1:16 PM Pager: [phone] Trauma Pager: [phone] Mon-Fri 7:00 am-4:30 pm Sat-Sun 7:00 am-11:30 am

## 2017-01-31 NOTE — Procedures (Signed)
Arterial Catheter Insertion Procedure Note Belinda BlockMary M Bohannon [id number] 10/09/1965  Procedure: Insertion of Arterial Catheter  Indications: Frequent blood sampling  Procedure Details Consent: Risks of procedure as well as the alternatives and risks of each were explained to the (patient/caregiver).  Consent for procedure obtained. Time Out: Verified patient identification, verified procedure, site/side was marked, verified correct patient position, special equipment/implants available, medications/allergies/relevent history reviewed, required imaging and test results available.  Performed  Maximum sterile technique was used including antiseptics, cap, gloves, gown, hand hygiene, mask and sheet. Skin prep: Chlorhexidine; local anesthetic administered 20 gauge catheter was inserted into right radial artery using the Seldinger technique.  Evaluation Blood flow good; BP tracing good. Complications: No apparent complications.   Morley KosJohnson, Yena Tisby Leroy 01/30/2017

## 2017-01-31 NOTE — Progress Notes (Signed)
Recruitment maneuver performed with 20 cm/H2O for 1 minute and FIO2 increased to 100% per CDS. ABG to be obtained in 30 minutes.
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
## 2017-01-31 NOTE — Progress Notes (Signed)
Transported to and from Nuclear Medicine for V/Q Scan on vent with 100% FIO2 .  Tolerated well.

## 2017-01-31 NOTE — Progress Notes (Signed)
   02/04/2017 1900  Clinical Encounter Type  Visited With Health care provider  Visit Type Death  Referral From Nurse  Consult/Referral To Chaplain   Responded to a page for a non stemi cath lab procedure.  Nurses informed me patient had died earlier and they are planning organ donation.  No family present.  Let the staff know that if family returns to please page me for support.  Chaplain Agustin CreeNewton Shaya Altamura

## 2017-02-01 ENCOUNTER — Encounter (HOSPITAL_COMMUNITY): Payer: Medicaid Other | Admitting: Certified Registered"

## 2017-02-01 ENCOUNTER — Encounter (HOSPITAL_COMMUNITY): Admission: EM | Disposition: E | Payer: Self-pay | Source: Home / Self Care

## 2017-02-01 ENCOUNTER — Inpatient Hospital Stay (HOSPITAL_COMMUNITY): Payer: Medicaid Other | Admitting: Certified Registered"

## 2017-02-01 ENCOUNTER — Encounter (HOSPITAL_COMMUNITY): Payer: Self-pay | Admitting: Interventional Cardiology

## 2017-02-01 HISTORY — PX: ORGAN PROCUREMENT: SHX5270

## 2017-02-01 HISTORY — PX: FLEXIBLE BRONCHOSCOPY: SHX5094

## 2017-02-01 LAB — COMPREHENSIVE METABOLIC PANEL
ALBUMIN: 2.2 g/dL — AB (ref 3.5–5.0)
ALBUMIN: 2.7 g/dL — AB (ref 3.5–5.0)
ALK PHOS: 113 U/L (ref 38–126)
ALK PHOS: 120 U/L (ref 38–126)
ALT: 74 U/L — AB (ref 14–54)
ALT: 91 U/L — ABNORMAL HIGH (ref 14–54)
ANION GAP: 12 (ref 5–15)
AST: 66 U/L — AB (ref 15–41)
AST: 90 U/L — AB (ref 15–41)
Anion gap: 12 (ref 5–15)
BILIRUBIN TOTAL: 0.8 mg/dL (ref 0.3–1.2)
BUN: 10 mg/dL (ref 6–20)
BUN: 7 mg/dL (ref 6–20)
CALCIUM: 7.7 mg/dL — AB (ref 8.9–10.3)
CALCIUM: 7.7 mg/dL — AB (ref 8.9–10.3)
CO2: 22 mmol/L (ref 22–32)
CO2: 23 mmol/L (ref 22–32)
CREATININE: 0.91 mg/dL (ref 0.44–1.00)
Chloride: 128 mmol/L — ABNORMAL HIGH (ref 101–111)
Chloride: 121 mmol/L — ABNORMAL HIGH (ref 101–111)
Creatinine, Ser: 0.92 mg/dL (ref 0.44–1.00)
GFR calc Af Amer: >60 mL/min (ref >60)
GFR calc Af Amer: >60 mL/min (ref >60)
GFR calc non Af Amer: >60 mL/min (ref >60)
GLUCOSE: 268 mg/dL — AB (ref 65–99)
GLUCOSE: 148 mg/dL — AB (ref 65–99)
Potassium: 3 mmol/L — ABNORMAL LOW (ref 3.5–5.1)
Potassium: 3.2 mmol/L — ABNORMAL LOW (ref 3.5–5.1)
SODIUM: 162 mmol/L — AB (ref 135–145)
Sodium: 156 mmol/L — ABNORMAL HIGH (ref 135–145)
TOTAL PROTEIN: 7.1 g/dL (ref 6.5–8.1)
Total Bilirubin: 0.9 mg/dL (ref 0.3–1.2)
Total Protein: 6.2 g/dL — ABNORMAL LOW (ref 6.5–8.1)

## 2017-02-01 LAB — CBC WITH DIFFERENTIAL/PLATELET
BASOS ABS: 0 10*3/uL (ref 0.0–0.1)
Basophils Relative: 0 %
EOS PCT: 0 %
Eosinophils Absolute: 0 10*3/uL (ref 0.0–0.7)
HEMATOCRIT: 28.6 % — AB (ref 36.0–46.0)
Hemoglobin: 9.1 g/dL — ABNORMAL LOW (ref 12.0–15.0)
LYMPHS ABS: 1.2 10*3/uL (ref 0.7–4.0)
LYMPHS PCT: 7 %
MCH: 29.9 pg (ref 26.0–34.0)
MCHC: 31.8 g/dL (ref 30.0–36.0)
MCV: 94.1 fL (ref 78.0–100.0)
MONOS PCT: 10 %
Monocytes Absolute: 1.7 10*3/uL — ABNORMAL HIGH (ref 0.1–1.0)
NEUTROS ABS: 14 10*3/uL — AB (ref 1.7–7.7)
Neutrophils Relative %: 83 %
Platelets: 194 10*3/uL (ref 150–400)
RBC: 3.04 MIL/uL — ABNORMAL LOW (ref 3.87–5.11)
RDW: 15.8 % — ABNORMAL HIGH (ref 11.5–15.5)
WBC: 16.9 10*3/uL — ABNORMAL HIGH (ref 4.0–10.5)

## 2017-02-01 LAB — URINE CULTURE: CULTURE: NO GROWTH

## 2017-02-01 LAB — POCT I-STAT 7, (LYTES, BLD GAS, ICA,H+H)
ACID-BASE EXCESS: 4 mmol/L — AB (ref 0.0–2.0)
ACID-BASE EXCESS: 6 mmol/L — AB (ref 0.0–2.0)
BICARBONATE: 28.2 mmol/L — AB (ref 20.0–28.0)
BICARBONATE: 28.9 mmol/L — AB (ref 20.0–28.0)
CALCIUM ION: 1.06 mmol/L — AB (ref 1.15–1.40)
CALCIUM ION: 1.01 mmol/L — AB (ref 1.15–1.40)
HCT: 26 % — ABNORMAL LOW (ref 36.0–46.0)
HCT: 24 % — ABNORMAL LOW (ref 36.0–46.0)
HEMOGLOBIN: 8.8 g/dL — AB (ref 12.0–15.0)
Hemoglobin: 8.2 g/dL — ABNORMAL LOW (ref 12.0–15.0)
O2 Saturation: 100 %
O2 Saturation: 100 %
PO2 ART: 268 mmHg — AB (ref 83.0–108.0)
POTASSIUM: 3.4 mmol/L — AB (ref 3.5–5.1)
Patient temperature: 34.9
Potassium: 4 mmol/L (ref 3.5–5.1)
SODIUM: 162 mmol/L — AB (ref 135–145)
Sodium: 163 mmol/L (ref 135–145)
TCO2: 29 mmol/L (ref 22–32)
TCO2: 30 mmol/L (ref 22–32)
pCO2 arterial: 41.7 mmHg (ref 32.0–48.0)
pCO2 arterial: 32.2 mmHg (ref 32.0–48.0)
pH, Arterial: 7.439 (ref 7.350–7.450)
pH, Arterial: 7.554 — ABNORMAL HIGH (ref 7.350–7.450)
pO2, Arterial: 292 mmHg — ABNORMAL HIGH (ref 83.0–108.0)

## 2017-02-01 LAB — URINALYSIS, ROUTINE W REFLEX MICROSCOPIC
Bilirubin Urine: NEGATIVE
GLUCOSE, UA: 50 mg/dL — AB
Hgb urine dipstick: NEGATIVE
Ketones, ur: NEGATIVE mg/dL
LEUKOCYTES UA: NEGATIVE
NITRITE: NEGATIVE
PROTEIN: NEGATIVE mg/dL
Specific Gravity, Urine: 1.012 (ref 1.005–1.030)
pH: 6 (ref 5.0–8.0)

## 2017-02-01 LAB — POCT I-STAT 3, ART BLOOD GAS (G3+)
ACID-BASE DEFICIT: 2 mmol/L (ref 0.0–2.0)
Acid-Base Excess: 4 mmol/L — ABNORMAL HIGH (ref 0.0–2.0)
BICARBONATE: 24.5 mmol/L (ref 20.0–28.0)
BICARBONATE: 29.1 mmol/L — AB (ref 20.0–28.0)
Bicarbonate: 25.1 mmol/L (ref 20.0–28.0)
O2 SAT: 99 %
O2 Saturation: 100 %
O2 Saturation: 100 %
PCO2 ART: 45.1 mmHg (ref 32.0–48.0)
PH ART: 7.357 (ref 7.350–7.450)
PH ART: 7.318 — AB (ref 7.350–7.450)
PO2 ART: 130 mmHg — AB (ref 83.0–108.0)
PO2 ART: 340 mmHg — AB (ref 83.0–108.0)
Patient temperature: 37.7
Patient temperature: 100.2
Patient temperature: 36.9
TCO2: 26 mmol/L (ref 22–32)
TCO2: 26 mmol/L (ref 22–32)
TCO2: 30 mmol/L (ref 22–32)
pCO2 arterial: 48.1 mmHg — ABNORMAL HIGH (ref 32.0–48.0)
pCO2 arterial: 45 mmHg (ref 32.0–48.0)
pH, Arterial: 7.419 (ref 7.350–7.450)
pO2, Arterial: 343 mmHg — ABNORMAL HIGH (ref 83.0–108.0)

## 2017-02-01 LAB — GLUCOSE, CAPILLARY
GLUCOSE-CAPILLARY: 239 mg/dL — AB (ref 65–99)
GLUCOSE-CAPILLARY: 187 mg/dL — AB (ref 65–99)
GLUCOSE-CAPILLARY: 175 mg/dL — AB (ref 65–99)
GLUCOSE-CAPILLARY: 152 mg/dL — AB (ref 65–99)
GLUCOSE-CAPILLARY: 162 mg/dL — AB (ref 65–99)
Glucose-Capillary: 123 mg/dL — ABNORMAL HIGH (ref 65–99)
Glucose-Capillary: 140 mg/dL — ABNORMAL HIGH (ref 65–99)
Glucose-Capillary: 148 mg/dL — ABNORMAL HIGH (ref 65–99)
Glucose-Capillary: 150 mg/dL — ABNORMAL HIGH (ref 65–99)
Glucose-Capillary: 153 mg/dL — ABNORMAL HIGH (ref 65–99)
Glucose-Capillary: 226 mg/dL — ABNORMAL HIGH (ref 65–99)
Glucose-Capillary: 266 mg/dL — ABNORMAL HIGH (ref 65–99)

## 2017-02-01 LAB — CBC
HCT: 29.6 % — ABNORMAL LOW (ref 36.0–46.0)
HEMOGLOBIN: 9.4 g/dL — AB (ref 12.0–15.0)
MCH: 29.9 pg (ref 26.0–34.0)
MCHC: 31.8 g/dL (ref 30.0–36.0)
MCV: 94.3 fL (ref 78.0–100.0)
Platelets: 233 10*3/uL (ref 150–400)
RBC: 3.14 MIL/uL — AB (ref 3.87–5.11)
RDW: 15.4 % (ref 11.5–15.5)
WBC: 12 10*3/uL — ABNORMAL HIGH (ref 4.0–10.5)

## 2017-02-01 LAB — INFLUENZA PANEL BY PCR (TYPE A & B)
INFLAPCR: NEGATIVE
Influenza B By PCR: NEGATIVE

## 2017-02-01 LAB — PHOSPHORUS: Phosphorus: 3.4 mg/dL (ref 2.5–4.6)

## 2017-02-01 LAB — PROTIME-INR
INR: 1.28
Prothrombin Time: 15.9 seconds — ABNORMAL HIGH (ref 11.4–15.2)

## 2017-02-01 LAB — MAGNESIUM: Magnesium: 2.5 mg/dL — ABNORMAL HIGH (ref 1.7–2.4)

## 2017-02-01 LAB — SODIUM: SODIUM: 159 mmol/L — AB (ref 135–145)

## 2017-02-01 SURGERY — SURGICAL PROCUREMENT, ORGAN
Anesthesia: General | Site: Chest

## 2017-02-01 MED ORDER — LABETALOL HCL 5 MG/ML IV SOLN
10.0000 mg | Freq: Once | INTRAVENOUS | Status: AC
  Administered 2017-02-01: 10 mg via INTRAVENOUS
  Filled 2017-02-01: qty 4

## 2017-02-01 MED ORDER — POTASSIUM CHLORIDE IN NACL 20-0.9 MEQ/L-% IV SOLN: INTRAVENOUS | Status: DC | PRN

## 2017-02-01 MED ORDER — ALBUMIN HUMAN 25 % IV SOLN
25.0000 g | Freq: Once | INTRAVENOUS | Status: AC
  Administered 2017-02-01: 25 g via INTRAVENOUS
  Filled 2017-02-01: qty 100

## 2017-02-01 MED ORDER — VASOPRESSIN 20 UNIT/ML IV SOLN
INTRAVENOUS | Status: AC
  Filled 2017-02-01: qty 1

## 2017-02-01 MED ORDER — LABETALOL HCL 5 MG/ML IV SOLN
10.0000 mg | INTRAVENOUS | Status: DC | PRN
  Administered 2017-02-01: 10 mg via INTRAVENOUS
  Filled 2017-02-01 (×2): qty 4

## 2017-02-01 MED ORDER — VASOPRESSIN 20 UNIT/ML IV SOLN
INTRAVENOUS | Status: DC | PRN
  Administered 2017-02-01: 2 [IU] via INTRAVENOUS

## 2017-02-01 MED ORDER — HEPARIN SODIUM (PORCINE) 1000 UNIT/ML IJ SOLN
INTRAMUSCULAR | Status: AC
  Filled 2017-02-01: qty 1

## 2017-02-01 MED ORDER — SODIUM CHLORIDE 0.45 % IV SOLN: INTRAVENOUS | Status: DC

## 2017-02-01 MED ORDER — SODIUM CHLORIDE 0.9 % IV SOLN
1000.0000 mg | Freq: Once | INTRAVENOUS | Status: DC
  Filled 2017-02-01: qty 8

## 2017-02-01 MED ORDER — MANNITOL 25 % IV SOLN
12.5000 g | Freq: Once | INTRAVENOUS | Status: AC
  Administered 2017-02-01: 12.5 g via INTRAVENOUS
  Filled 2017-02-01 (×2): qty 50

## 2017-02-01 MED ORDER — SODIUM CHLORIDE 0.9 % IV SOLN
1000.0000 mg | Freq: Once | INTRAVENOUS | Status: AC
  Administered 2017-02-01: 1000 mg via INTRAVENOUS
  Filled 2017-02-01: qty 8

## 2017-02-01 MED ORDER — FUROSEMIDE 10 MG/ML IJ SOLN
20.0000 mg | Freq: Once | INTRAMUSCULAR | Status: AC
  Administered 2017-02-01: 20 mg via INTRAVENOUS
  Filled 2017-02-01: qty 2

## 2017-02-01 MED ORDER — 0.9 % SODIUM CHLORIDE (POUR BTL) OPTIME
TOPICAL | Status: DC | PRN
  Administered 2017-02-01: 10000 mL

## 2017-02-01 MED ORDER — PIPERACILLIN-TAZOBACTAM 3.375 G IVPB 30 MIN
3.3750 g | INTRAVENOUS | Status: AC
  Administered 2017-02-01: 3.375 g via INTRAVENOUS
  Filled 2017-02-01: qty 50

## 2017-02-01 MED ORDER — LABETALOL HCL 5 MG/ML IV SOLN
5.0000 mg | Freq: Once | INTRAVENOUS | Status: AC
  Administered 2017-02-01: 5 mg via INTRAVENOUS
  Filled 2017-02-01: qty 4

## 2017-02-01 MED ORDER — POTASSIUM CHLORIDE 10 MEQ/50ML IV SOLN
10.0000 meq | INTRAVENOUS | Status: AC
  Administered 2017-02-01 (×4): 10 meq via INTRAVENOUS
  Filled 2017-02-01 (×4): qty 50

## 2017-02-01 MED ORDER — ROCURONIUM BROMIDE 100 MG/10ML IV SOLN
INTRAVENOUS | Status: DC | PRN
  Administered 2017-02-01: 100 mg via INTRAVENOUS

## 2017-02-01 MED ORDER — PIPERACILLIN-TAZOBACTAM 3.375 G IVPB 30 MIN
3.3750 g | Freq: Once | INTRAVENOUS | Status: AC
  Administered 2017-02-01: 3.375 g via INTRAVENOUS
  Filled 2017-02-01: qty 50

## 2017-02-01 MED ORDER — ALBUMIN HUMAN 5 % IV SOLN
INTRAVENOUS | Status: DC | PRN
  Administered 2017-02-01: 14:00:00 via INTRAVENOUS

## 2017-02-01 MED ORDER — HEPARIN SODIUM (PORCINE) 1000 UNIT/ML IJ SOLN
INTRAMUSCULAR | Status: DC | PRN
  Administered 2017-02-01: 30000 [IU] via INTRAVENOUS

## 2017-02-01 MED ORDER — POTASSIUM CHLORIDE IN NACL 20-0.45 MEQ/L-% IV SOLN
INTRAVENOUS | Status: DC
  Administered 2017-02-01: 11:00:00 via INTRAVENOUS
  Filled 2017-02-01: qty 1000

## 2017-02-01 MED FILL — Lidocaine HCl Local Preservative Free (PF) Inj 1%: INTRAMUSCULAR | Qty: 30 | Status: AC

## 2017-02-01 MED FILL — Heparin Sodium (Porcine) 2 Unit/ML in Sodium Chloride 0.9%: INTRAMUSCULAR | Qty: 1000 | Status: AC

## 2017-02-01 SURGICAL SUPPLY — 75 items
BLADE 10 SAFETY STRL DISP (BLADE) ×4 IMPLANT
BLADE CLIPPER SURG (BLADE) IMPLANT
BLADE SAW STERNAL (BLADE) ×4 IMPLANT
BLADE SURG 10 STRL SS (BLADE) ×8 IMPLANT
CANNULA VESSEL W/WING WO/VALVE (CANNULA) IMPLANT
CLIP TI LARGE 6 (CLIP) ×8 IMPLANT
CLIP VESOCCLUDE MED 24/CT (CLIP) ×4 IMPLANT
CLIP VESOCCLUDE SM WIDE 24/CT (CLIP) ×4 IMPLANT
CONT SPEC 4OZ CLIKSEAL STRL BL (MISCELLANEOUS) ×16 IMPLANT
COVER BACK TABLE 60X90IN (DRAPES) IMPLANT
COVER MAYO STAND STRL (DRAPES) ×4 IMPLANT
COVER SURGICAL LIGHT HANDLE (MISCELLANEOUS) ×8 IMPLANT
DRAPE HALF SHEET 40X57 (DRAPES) IMPLANT
DRAPE SLUSH MACHINE 52X66 (DRAPES) ×8 IMPLANT
DRSG COVADERM 4X10 (GAUZE/BANDAGES/DRESSINGS) ×8 IMPLANT
DRSG COVADERM 4X8 (GAUZE/BANDAGES/DRESSINGS) ×4 IMPLANT
DRSG TELFA 3X8 NADH (GAUZE/BANDAGES/DRESSINGS) ×8 IMPLANT
DURAPREP 26ML APPLICATOR (WOUND CARE) IMPLANT
ELECT BLADE 6.5 EXT (BLADE) ×8 IMPLANT
ELECT CAUTERY BLADE 6.4 (BLADE) ×8 IMPLANT
ELECT REM PT RETURN 9FT ADLT (ELECTROSURGICAL) ×8
ELECTRODE REM PT RTRN 9FT ADLT (ELECTROSURGICAL) ×4 IMPLANT
GAUZE SPONGE 4X4 16PLY XRAY LF (GAUZE/BANDAGES/DRESSINGS) IMPLANT
GLOVE BIO SURGEON STRL SZ 6.5 (GLOVE) ×18 IMPLANT
GLOVE BIO SURGEON STRL SZ7 (GLOVE) ×32 IMPLANT
GLOVE BIO SURGEONS STRL SZ 6.5 (GLOVE) ×6
GLOVE BIOGEL PI IND STRL 6.5 (GLOVE) ×12 IMPLANT
GLOVE BIOGEL PI IND STRL 7.0 (GLOVE) ×14 IMPLANT
GLOVE BIOGEL PI INDICATOR 6.5 (GLOVE) ×12
GLOVE BIOGEL PI INDICATOR 7.0 (GLOVE) ×14
GLOVE SURG SS PI 7.0 STRL IVOR (GLOVE) ×16 IMPLANT
GOWN STRL REUS W/ TWL LRG LVL3 (GOWN DISPOSABLE) ×8 IMPLANT
GOWN STRL REUS W/TWL LRG LVL3 (GOWN DISPOSABLE) ×16
INSERT FOGARTY 61MM (MISCELLANEOUS) ×4 IMPLANT
KIT POST MORTEM ADULT 36X90 (BAG) ×4 IMPLANT
KIT ROOM TURNOVER OR (KITS) ×4 IMPLANT
LOOP VESSEL MAXI BLUE (MISCELLANEOUS) ×4 IMPLANT
LOOP VESSEL MINI RED (MISCELLANEOUS) ×4 IMPLANT
MANIFOLD NEPTUNE II (INSTRUMENTS) IMPLANT
NEEDLE BIOPSY 14X6 SOFT TISS (NEEDLE) ×4 IMPLANT
NS IRRIG 1000ML POUR BTL (IV SOLUTION) IMPLANT
PACK AORTA (CUSTOM PROCEDURE TRAY) ×4 IMPLANT
PAD ARMBOARD 7.5X6 YLW CONV (MISCELLANEOUS) ×8 IMPLANT
PENCIL BUTTON HOLSTER BLD 10FT (ELECTRODE) ×4 IMPLANT
SOL PREP POV-IOD 4OZ 10% (MISCELLANEOUS) ×4 IMPLANT
SPONGE INTESTINAL PEANUT (DISPOSABLE) ×8 IMPLANT
SPONGE LAP 18X18 X RAY DECT (DISPOSABLE) ×16 IMPLANT
STAPLER VISISTAT 35W (STAPLE) ×8 IMPLANT
SUCTION POOLE TIP (SUCTIONS) ×8 IMPLANT
SUT BONE WAX W31G (SUTURE) ×4 IMPLANT
SUT ETHIBOND 5 LR DA (SUTURE) IMPLANT
SUT ETHILON 1 LR 30 (SUTURE) ×8 IMPLANT
SUT ETHILON 2 LR (SUTURE) IMPLANT
SUT PROLENE 4 0 RB 1 (SUTURE) ×8
SUT PROLENE 4-0 RB1 18X2 ARM (SUTURE) ×4 IMPLANT
SUT PROLENE 6 0 BV (SUTURE) IMPLANT
SUT SILK 0 TIES 10X30 (SUTURE) ×4 IMPLANT
SUT SILK 1 SH (SUTURE) IMPLANT
SUT SILK 1 TIES 10X30 (SUTURE) IMPLANT
SUT SILK 2 0 (SUTURE)
SUT SILK 2 0 SH (SUTURE) ×4 IMPLANT
SUT SILK 2 0 SH CR/8 (SUTURE) ×4 IMPLANT
SUT SILK 2 0 TIES 10X30 (SUTURE) ×4 IMPLANT
SUT SILK 2-0 18XBRD TIE 12 (SUTURE) IMPLANT
SUT SILK 3 0 TIES 10X30 (SUTURE) IMPLANT
SWAB COLLECTION DEVICE MRSA (MISCELLANEOUS) IMPLANT
SWAB CULTURE ESWAB REG 1ML (MISCELLANEOUS) IMPLANT
SYRINGE TOOMEY DISP (SYRINGE) ×4 IMPLANT
TAPE UMBILICAL 1/8 X36 TWILL (MISCELLANEOUS) ×16 IMPLANT
TOWEL OR 17X24 6PK STRL BLUE (TOWEL DISPOSABLE) ×4 IMPLANT
TOWEL OR 17X26 10 PK STRL BLUE (TOWEL DISPOSABLE) ×8 IMPLANT
TUBE CONNECTING 12'X1/4 (SUCTIONS) ×3
TUBE CONNECTING 12X1/4 (SUCTIONS) ×9 IMPLANT
WATER STERILE IRR 1000ML POUR (IV SOLUTION) IMPLANT
YANKAUER SUCT BULB TIP NO VENT (SUCTIONS) ×4 IMPLANT
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
## 2017-02-01 NOTE — Progress Notes (Signed)
Pt taken off vent by CRNA to transport to OR using an ambu bag for manual ventilation.

## 2017-02-01 NOTE — Addendum Note (Signed)
Addendum  created 04-19-2017 1649 by Izola Priceockfield, Jona Zappone Walton Jr., CRNA   Intraprocedure Meds edited

## 2017-02-01 NOTE — Anesthesia Postprocedure Evaluation (Signed)
Anesthesia Post Note  Patient: Lauren Farmer  Procedure(s) Performed: ORGAN PROCUREMENT heart, lung, liver, kidneys (N/A ) FLEXIBLE BRONCHOSCOPY (N/A Chest)     Patient location during evaluation: Other Anesthesia Type: General Level of consciousness: comatose Pain management: pain level controlled Respiratory status: patient remains intubated per anesthesia plan Cardiovascular status: stable Postop Assessment: no apparent nausea or vomiting Anesthetic complications: no Comments: Patient previously pronounced brain dead for organ procurement.    Last Vitals:  Vitals:   01/26/2017 1300 01/29/2017 1315  BP: 123/70   Pulse: 83   Resp: 18   Temp: 36.6 C   SpO2: 98% 99%    Last Pain:  Vitals:   01/23/2017 1200  TempSrc: Bladder                 Audry Pili

## 2017-02-01 NOTE — Transfer of Care (Signed)
Immediate Anesthesia Transfer of Care Note  Patient: Lauren Farmer  Procedure(s) Performed: ORGAN PROCUREMENT heart, lung, liver, kidneys (N/A ) FLEXIBLE BRONCHOSCOPY (N/A Chest)  Patient Location: Organ procurement in OR  Anesthesia Type:General  Level of Consciousness: Patient remains intubated per anesthesia plan  Airway & Oxygen Therapy: Patient placed on Ventilator (see vital sign flow sheet for setting)  Post-op Assessment: cross clamp on at 1515  Post vital signs: Reviewed  Last Vitals:  Vitals:   01/23/2017 1300 01/25/2017 1315  BP: 123/70   Pulse: 83   Resp: 18   Temp: 36.6 C   SpO2: 98% 99%    Last Pain:  Vitals:   01/10/2017 1200  TempSrc: Bladder         Complications: No apparent anesthesia complications

## 2017-02-02 ENCOUNTER — Encounter (HOSPITAL_COMMUNITY): Payer: Self-pay

## 2017-02-02 LAB — TYPE AND SCREEN
ABO/RH(D): A POS
Antibody Screen: NEGATIVE
UNIT DIVISION: 0
UNIT DIVISION: 0
Unit division: 0
Unit division: 0

## 2017-02-02 LAB — BPAM RBC
BLOOD PRODUCT EXPIRATION DATE: 201902092359
Blood Product Expiration Date: 201902072359
Blood Product Expiration Date: 201902082359
Blood Product Expiration Date: 201902082359
UNIT TYPE AND RH: 6200
Unit Type and Rh: 6200
Unit Type and Rh: 6200
Unit Type and Rh: 6200

## 2017-02-02 LAB — CULTURE, BAL-QUANTITATIVE W GRAM STAIN: Culture: NO GROWTH

## 2017-02-02 LAB — CULTURE, BAL-QUANTITATIVE

## 2017-02-03 ENCOUNTER — Encounter: Payer: Self-pay | Admitting: Nurse Practitioner

## 2017-02-05 LAB — CULTURE, BLOOD (ROUTINE X 2)
CULTURE: NO GROWTH
Culture: NO GROWTH
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
## 2017-02-10 DEATH — deceased
